# Patient Record
Sex: Female | Born: 1985 | Race: Black or African American | Hispanic: No | Marital: Single | Smoking: Never smoker
Health system: Southern US, Community
[De-identification: ages and names within clinical notes are randomized; demographics above are authoritative.]

## PROBLEM LIST (undated history)

## (undated) ENCOUNTER — Inpatient Hospital Stay (HOSPITAL_COMMUNITY): Payer: Self-pay

## (undated) DIAGNOSIS — E669 Obesity, unspecified: Secondary | ICD-10-CM

## (undated) DIAGNOSIS — Z789 Other specified health status: Secondary | ICD-10-CM

## (undated) DIAGNOSIS — O24419 Gestational diabetes mellitus in pregnancy, unspecified control: Secondary | ICD-10-CM

## (undated) HISTORY — PX: NO PAST SURGERIES: SHX2092

## (undated) HISTORY — DX: Other specified health status: Z78.9

---

## 2020-03-09 ENCOUNTER — Emergency Department (HOSPITAL_COMMUNITY)
Admission: EM | Admit: 2020-03-09 | Discharge: 2020-03-09 | Disposition: A | Discharge status: Home / Self Care | Payer: Self-pay | Attending: Emergency Medicine | Admitting: Emergency Medicine

## 2020-03-09 ENCOUNTER — Emergency Department (HOSPITAL_COMMUNITY): Payer: Self-pay

## 2020-03-09 ENCOUNTER — Encounter (HOSPITAL_COMMUNITY): Payer: Self-pay | Admitting: Emergency Medicine

## 2020-03-09 ENCOUNTER — Other Ambulatory Visit: Payer: Self-pay

## 2020-03-09 DIAGNOSIS — K2950 Unspecified chronic gastritis without bleeding: Secondary | ICD-10-CM | POA: Insufficient documentation

## 2020-03-09 DIAGNOSIS — K2970 Gastritis, unspecified, without bleeding: Secondary | ICD-10-CM

## 2020-03-09 LAB — URINALYSIS, ROUTINE W REFLEX MICROSCOPIC
Bilirubin Urine: NEGATIVE
Glucose, UA: NEGATIVE mg/dL
Ketones, ur: NEGATIVE mg/dL
Nitrite: NEGATIVE
Protein, ur: NEGATIVE mg/dL
Specific Gravity, Urine: 1.012 (ref 1.005–1.030)
pH: 5 (ref 5.0–8.0)

## 2020-03-09 LAB — LIPASE, BLOOD: Lipase: 37 U/L (ref 11–51)

## 2020-03-09 LAB — COMPREHENSIVE METABOLIC PANEL
ALT: 14 U/L (ref 0–44)
AST: 17 U/L (ref 15–41)
Albumin: 3.9 g/dL (ref 3.5–5.0)
Alkaline Phosphatase: 47 U/L (ref 38–126)
Anion gap: 11 (ref 5–15)
BUN: 9 mg/dL (ref 6–20)
CO2: 25 mmol/L (ref 22–32)
Calcium: 8.7 mg/dL — ABNORMAL LOW (ref 8.9–10.3)
Chloride: 100 mmol/L (ref 98–111)
Creatinine, Ser: 0.56 mg/dL (ref 0.44–1.00)
GFR, Estimated: >60 mL/min (ref >60)
Glucose, Bld: 104 mg/dL — ABNORMAL HIGH (ref 70–99)
Potassium: 3.7 mmol/L (ref 3.5–5.1)
Sodium: 136 mmol/L (ref 135–145)
Total Bilirubin: 0.6 mg/dL (ref 0.3–1.2)
Total Protein: 7.5 g/dL (ref 6.5–8.1)

## 2020-03-09 LAB — CBC
HCT: 39.8 % (ref 36.0–46.0)
Hemoglobin: 13 g/dL (ref 12.0–15.0)
MCH: 28.1 pg (ref 26.0–34.0)
MCHC: 32.7 g/dL (ref 30.0–36.0)
MCV: 86.1 fL (ref 80.0–100.0)
Platelets: 273 10*3/uL (ref 150–400)
RBC: 4.62 MIL/uL (ref 3.87–5.11)
RDW: 12.7 % (ref 11.5–15.5)
WBC: 7.7 10*3/uL (ref 4.0–10.5)
nRBC: 0 % (ref 0.0–0.2)

## 2020-03-09 LAB — I-STAT BETA HCG BLOOD, ED (MC, WL, AP ONLY): I-stat hCG, quantitative: <5 m[IU]/mL (ref <5)

## 2020-03-09 MED ORDER — FAMOTIDINE 20 MG PO TABS: 20.0000 mg | ORAL_TABLET | Freq: Two times a day (BID) | ORAL | 0 refills | Status: DC

## 2020-03-09 MED ORDER — LIDOCAINE VISCOUS HCL 2 % MT SOLN
15.0000 mL | Freq: Once | OROMUCOSAL | Status: AC
  Administered 2020-03-09: 15 mL via ORAL
  Filled 2020-03-09: qty 15

## 2020-03-09 MED ORDER — DICYCLOMINE HCL 10 MG PO CAPS
10.0000 mg | ORAL_CAPSULE | Freq: Once | ORAL | Status: AC
  Administered 2020-03-09: 10 mg via ORAL
  Filled 2020-03-09: qty 1

## 2020-03-09 MED ORDER — ALUM & MAG HYDROXIDE-SIMETH 200-200-20 MG/5ML PO SUSP
30.0000 mL | Freq: Once | ORAL | Status: AC
  Administered 2020-03-09: 30 mL via ORAL
  Filled 2020-03-09: qty 30

## 2020-03-09 MED ORDER — IOHEXOL 300 MG/ML  SOLN
100.0000 mL | Freq: Once | INTRAMUSCULAR | Status: AC | PRN
  Administered 2020-03-09: 100 mL via INTRAVENOUS

## 2020-03-09 NOTE — ED Notes (Signed)
DC instructions reviewed with family.  Family verbalizes understanding and will communicate to pt who does not speak [place].  Pt DC.

## 2020-03-09 NOTE — Discharge Instructions (Signed)
Ayyukanku na yau ya kasance mai gamsarwa gaba?aya. Scan naku bai nuna wani mummunan lahani ba. Ina tsammanin alamun ku na iya zama na biyu zuwa ga gastritis ko ciwon ciki. Don Allah a sha maganin da na rubuta kamar yadda aka umarce ni. Hakanan kuna iya samun wannan kan-da-counter. Da fatan Ronald Lobo a duba takardar bayanin abincin da za ku guje wa don kada ya tsananta alamun ku. Da fatan Ronald Lobo a tabbatar da bin diddigin likita na farko, idan ba ku da ?aya ko ba za ku iya ba, na ba da bayanin tuntu?ar Insurance risk surveyor [facility] wanda ke da asibiti kyauta a nan yankin. [place] ga ER don kowane sabon ko mummuna bayyanar cututtuka.     Your work-up today was overall reassuring.  Your scan did not show any acute abnormalities.  I suspect that your symptoms may be secondary to gastritis or gastric reflux disease.  Please take the medicine I have prescribed as directed.  You may also find this over-the-counter.  Please see the handout on which foods to avoid to not exacerbate your symptoms.  Please make sure to follow-up with the primary care doctor, if you do not have one or cannot afford one, I have provided the contact information for [facility] which is a free clinic here in the area.  Return to the ER for any new or worsening symptoms.

## 2020-03-09 NOTE — ED Provider Notes (Signed)
[facility] EMERGENCY DEPARTMENT Provider Note   CSN: [id number] Arrival date & time: 03/09/20  0857     History Chief Complaint  Patient presents with  . Abdominal Pain    April Boone is a 35 y.o. female.  HPI 35 year old female with no significant medical history per chart review presents to the ER with complaints of epigastric abdominal pain.  History provided by the patient and her friend at bedside who serves as her Nurse, learning disability, patient refusing using a Careers information officer.  States she has had epigastric pain over the last several years, however this has been sitting over the last 3 days.  Describes the pain as "walking around".  States he will radiate into her chest.  Denies any nausea or vomiting.  No fevers or chills.  She has had normal bowel movements.  No dysuria, vaginal bleeding, or discharge.  She states the pain starts if she does not eat, only gets mildly better if she does.  Pain fluctuates, she has not noticed a pattern to it.  Has not taken anything for her symptoms.    History reviewed. No pertinent past medical history.  There are no problems to display for this patient.   History reviewed. No pertinent surgical history.   OB History   No obstetric history on file.     No family history on file.  Social History   Tobacco Use  . Smoking status: Never Smoker  . Smokeless tobacco: Never Used  Substance Use Topics  . Alcohol use: Not Currently  . Drug use: Not Currently    Home Medications Prior to Admission medications   Not on File    Allergies    Patient has no allergy information on record.  Review of Systems   Review of Systems  Constitutional: Negative for chills and fever.  HENT: Negative for ear pain and sore throat.   Eyes: Negative for pain and visual disturbance.  Respiratory: Negative for cough and shortness of breath.   Cardiovascular: Negative for chest pain and palpitations.  Gastrointestinal: Positive  for abdominal pain. Negative for vomiting.  Genitourinary: Negative for dysuria and hematuria.  Musculoskeletal: Negative for arthralgias and back pain.  Skin: Negative for color change and rash.  Neurological: Negative for seizures and syncope.  All other systems reviewed and are negative.   Physical Exam Updated Vital Signs BP 102/66   Pulse 60   Temp 97.7 F (36.5 C)   Resp 18   LMP 02/25/2020   SpO2 98%   Physical Exam Vitals and nursing note reviewed.  Constitutional:      General: She is not in acute distress.    Appearance: She is well-developed and well-nourished. She is not ill-appearing or diaphoretic.  HENT:     Head: Normocephalic and atraumatic.  Eyes:     Conjunctiva/sclera: Conjunctivae normal.  Cardiovascular:     Rate and Rhythm: Normal rate and regular rhythm.     Heart sounds: No murmur heard.   Pulmonary:     Effort: Pulmonary effort is normal. No respiratory distress.     Breath sounds: Normal breath sounds.  Abdominal:     Palpations: Abdomen is soft.     Tenderness: There is abdominal tenderness. There is no right CVA tenderness or left CVA tenderness. Negative signs include Murphy's sign and obturator sign.     Comments: Mild epigastric discomfort on exam, no focal tenderness.  Musculoskeletal:        General: No edema.  Cervical back: Neck supple.  Skin:    General: Skin is warm and dry.  Neurological:     Mental Status: She is alert.  Psychiatric:        Mood and Affect: Mood and affect normal.     ED Results / Procedures / Treatments   Labs (all labs ordered are listed, but only abnormal results are displayed) Labs Reviewed  COMPREHENSIVE METABOLIC PANEL - Abnormal; Notable for the following components:      Result Value   Glucose, Bld 104 (*)    Calcium 8.7 (*)    All other components within normal limits  URINALYSIS, ROUTINE W REFLEX MICROSCOPIC - Abnormal; Notable for the following components:   APPearance CLOUDY (*)     Hgb urine dipstick SMALL (*)    Leukocytes,Ua MODERATE (*)    Bacteria, UA RARE (*)    All other components within normal limits  LIPASE, BLOOD  CBC  I-STAT BETA HCG BLOOD, ED ([facility], [facility], [facility] ONLY)    EKG None  Radiology CT ABDOMEN PELVIS W CONTRAST  Result Date: 03/09/2020 CLINICAL DATA:  35 year old female with epigastric pain. EXAM: CT ABDOMEN AND PELVIS WITH CONTRAST TECHNIQUE: Multidetector CT imaging of the abdomen and pelvis was performed using the standard protocol following bolus administration of intravenous contrast. CONTRAST:  OMNIPAQUE IOHEXOL 300 MG/ML  SOLN COMPARISON:  None. FINDINGS: Lower chest: The visualized lung bases are clear. No intra-abdominal free air or free fluid. Hepatobiliary: No focal liver abnormality is seen. No gallstones, gallbladder wall thickening, or biliary dilatation. Pancreas: Unremarkable. No pancreatic ductal dilatation or surrounding inflammatory changes. Spleen: Normal in size without focal abnormality. Adrenals/Urinary Tract: Adrenal glands are unremarkable. Kidneys are normal, without renal calculi, focal lesion, or hydronephrosis. Bladder is unremarkable. Stomach/Bowel: There is no bowel obstruction or active inflammation. The appendix is normal. Vascular/Lymphatic: The abdominal aorta and IVC are unremarkable. No portal venous gas. There is no adenopathy. Reproductive: The uterus is anteverted and grossly unremarkable. A 2 cm left ovarian dominant follicle. The right ovary is unremarkable. Other: None Musculoskeletal: No acute or significant osseous findings. IMPRESSION: 1. No acute intra-abdominal or pelvic pathology. No bowel obstruction. Normal appendix. 2. A 2 cm left ovarian dominant follicle. Electronically Signed   By: Elgie Collard M.D.   On: 03/09/2020 19:56    Procedures Procedures (including critical care time)  Medications Ordered in ED Medications  dicyclomine (BENTYL) capsule 10 mg (has no administration in time range)  alum &  mag hydroxide-simeth (MAALOX/MYLANTA) 200-200-20 MG/5ML suspension 30 mL (30 mLs Oral Given 03/09/20 1644)    And  lidocaine (XYLOCAINE) 2 % viscous mouth solution 15 mL (15 mLs Oral Given 03/09/20 1644)  iohexol (OMNIPAQUE) 300 MG/ML solution 100 mL (100 mLs Intravenous Contrast Given 03/09/20 1945)    ED Course  I have reviewed the triage vital signs and the nursing notes.  Pertinent labs & imaging results that were available during my care of the patient were reviewed by me and considered in my medical decision making (see chart for details).    MDM Rules/Calculators/A&P                          35 year old female who presents to the ER with waxing waning upper abdominal pain, worsening over the last 3 days.  On arrival, vitals overall reassuring.  Afebrile, not tachycardic.  Physical exam with very mild epigastric tenderness on exam.  No focal lower abdominal tenderness.  Negative Murphy's.  DDx includes cholecystitis, GERD/enteritis, pancreatitis  Labs ordered in triage reviewed and interpreted by me.  CMP and CBC largely unremarkable.  UA with some moderate leukocytes, rare bacteria, however patient denies any dysuria or hematuria.  Low suspicion for UTI, pyelonephritis.  CT of the abdomen ordered and interpreted by me.  This is overall normal with no acute abnormalities.  Low suspicion for cholecystitis, pancreatitis or any other emergency intra-abdominal pathology.  Patient treated with GI cocktail with mild improvement.  Patient was also given some Bentyl.  Patient is hungry, asking for food, tolerating well.  Suspect GERD/gastritis given longevity of symptoms.  Will send home with some Pepcid.  Encouraged PCP follow-up. Final Clinical Impression(s) / ED Diagnoses Final diagnoses:  Gastritis without bleeding, unspecified chronicity, unspecified gastritis type    Rx / DC Orders ED Discharge Orders    None       Leone Brand 03/09/20 2008    Tegeler, Canary Brim,  MD 03/09/20 2009

## 2020-03-09 NOTE — ED Triage Notes (Signed)
C/o generalized abd pain x 3 days.  Denies nausea, vomiting, diarrhea, and urinary complaints.  States she has been having intermittent pain for several years.

## 2020-05-04 ENCOUNTER — Encounter (HOSPITAL_COMMUNITY): Payer: Self-pay | Admitting: Emergency Medicine

## 2020-05-04 ENCOUNTER — Emergency Department (HOSPITAL_COMMUNITY)
Admission: EM | Admit: 2020-05-04 | Discharge: 2020-05-05 | Disposition: A | Discharge status: Home / Self Care | Payer: Self-pay | Attending: Emergency Medicine | Admitting: Emergency Medicine

## 2020-05-04 DIAGNOSIS — R1084 Generalized abdominal pain: Secondary | ICD-10-CM | POA: Insufficient documentation

## 2020-05-04 DIAGNOSIS — N390 Urinary tract infection, site not specified: Secondary | ICD-10-CM

## 2020-05-04 DIAGNOSIS — R300 Dysuria: Secondary | ICD-10-CM | POA: Insufficient documentation

## 2020-05-04 DIAGNOSIS — B9689 Other specified bacterial agents as the cause of diseases classified elsewhere: Secondary | ICD-10-CM | POA: Insufficient documentation

## 2020-05-04 DIAGNOSIS — R82998 Other abnormal findings in urine: Secondary | ICD-10-CM | POA: Insufficient documentation

## 2020-05-04 LAB — URINALYSIS, ROUTINE W REFLEX MICROSCOPIC
Bilirubin Urine: NEGATIVE
Glucose, UA: NEGATIVE mg/dL
Hgb urine dipstick: NEGATIVE
Ketones, ur: NEGATIVE mg/dL
Nitrite: NEGATIVE
Protein, ur: NEGATIVE mg/dL
Specific Gravity, Urine: 1.013 (ref 1.005–1.030)
pH: 7 (ref 5.0–8.0)

## 2020-05-04 LAB — COMPREHENSIVE METABOLIC PANEL
ALT: 15 U/L (ref 0–44)
AST: 18 U/L (ref 15–41)
Albumin: 3.8 g/dL (ref 3.5–5.0)
Alkaline Phosphatase: 48 U/L (ref 38–126)
Anion gap: 8 (ref 5–15)
BUN: 11 mg/dL (ref 6–20)
CO2: 23 mmol/L (ref 22–32)
Calcium: 9.2 mg/dL (ref 8.9–10.3)
Chloride: 106 mmol/L (ref 98–111)
Creatinine, Ser: 0.84 mg/dL (ref 0.44–1.00)
GFR, Estimated: >60 mL/min (ref >60)
Glucose, Bld: 101 mg/dL — ABNORMAL HIGH (ref 70–99)
Potassium: 4.2 mmol/L (ref 3.5–5.1)
Sodium: 137 mmol/L (ref 135–145)
Total Bilirubin: 0.4 mg/dL (ref 0.3–1.2)
Total Protein: 7.3 g/dL (ref 6.5–8.1)

## 2020-05-04 LAB — CBC
HCT: 39.5 % (ref 36.0–46.0)
Hemoglobin: 13.3 g/dL (ref 12.0–15.0)
MCH: 28.9 pg (ref 26.0–34.0)
MCHC: 33.7 g/dL (ref 30.0–36.0)
MCV: 85.9 fL (ref 80.0–100.0)
Platelets: 252 10*3/uL (ref 150–400)
RBC: 4.6 MIL/uL (ref 3.87–5.11)
RDW: 12.3 % (ref 11.5–15.5)
WBC: 6.8 10*3/uL (ref 4.0–10.5)
nRBC: 0 % (ref 0.0–0.2)

## 2020-05-04 LAB — I-STAT BETA HCG BLOOD, ED (MC, WL, AP ONLY): I-stat hCG, quantitative: <5 m[IU]/mL (ref <5)

## 2020-05-04 LAB — LIPASE, BLOOD: Lipase: 44 U/L (ref 11–51)

## 2020-05-04 MED ORDER — KETOROLAC TROMETHAMINE 30 MG/ML IJ SOLN
30.0000 mg | Freq: Once | INTRAMUSCULAR | Status: AC
  Administered 2020-05-04: 30 mg via INTRAVENOUS
  Filled 2020-05-04: qty 1

## 2020-05-04 MED ORDER — NITROFURANTOIN MONOHYD MACRO 100 MG PO CAPS
100.0000 mg | ORAL_CAPSULE | Freq: Once | ORAL | Status: AC
  Administered 2020-05-05: 100 mg via ORAL
  Filled 2020-05-04: qty 1

## 2020-05-04 MED ORDER — NITROFURANTOIN MONOHYD MACRO 100 MG PO CAPS: 100.0000 mg | ORAL_CAPSULE | Freq: Two times a day (BID) | ORAL | 0 refills | Status: DC

## 2020-05-04 NOTE — ED Provider Notes (Addendum)
[facility] EMERGENCY DEPARTMENT Provider Note   CSN: [id number] Arrival date & time: 05/04/20  1842     History Chief Complaint  Patient presents with  . Abdominal Pain    April Boone is a 35 y.o. female.  HPI   35 year old female with past medical history of GERD presents to the emergency department with concern for generalized abdominal discomfort for the past 2 to 3 days with intermittent dysuria at the end of urination.  Patient states the pain is crampy in nature, comes and goes, not related with eating.  She denies any nausea/vomiting/diarrhea.  She states sometimes when she urinates she has a burning sensation at the very end but denies any vaginal bleeding/discharge or other genitourinary symptoms.  No fever.  States she has regular menstrual periods and is not pregnant.  History reviewed. No pertinent past medical history.  There are no problems to display for this patient.   History reviewed. No pertinent surgical history.   OB History   No obstetric history on file.     No family history on file.  Social History   Tobacco Use  . Smoking status: Never Smoker  . Smokeless tobacco: Never Used  Substance Use Topics  . Alcohol use: Not Currently  . Drug use: Not Currently    Home Medications Prior to Admission medications   Medication Sig Start Date End Date Taking? Authorizing Provider  famotidine (PEPCID) 20 MG tablet Take 1 tablet (20 mg total) by mouth 2 (two) times daily. 03/09/20   Mare Ferrari, PA-C    Allergies    Patient has no known allergies.  Review of Systems   Review of Systems  Constitutional: Negative for chills and fever.  HENT: Negative for congestion.   Eyes: Negative for visual disturbance.  Respiratory: Negative for shortness of breath.   Cardiovascular: Negative for chest pain.  Gastrointestinal: Positive for abdominal pain. Negative for diarrhea and vomiting.  Genitourinary: Positive for dysuria.  Negative for difficulty urinating, frequency, vaginal bleeding, vaginal discharge and vaginal pain.  Skin: Negative for rash.  Neurological: Negative for headaches.    Physical Exam Updated Vital Signs BP 121/79   Pulse 78   Temp 98.3 F (36.8 C) (Oral)   Resp 18   LMP 04/20/2020   SpO2 100%   Physical Exam Vitals and nursing note reviewed.  Constitutional:      Appearance: Normal appearance.  HENT:     Head: Normocephalic.     Mouth/Throat:     Mouth: Mucous membranes are moist.  Cardiovascular:     Rate and Rhythm: Normal rate.  Pulmonary:     Effort: Pulmonary effort is normal. No respiratory distress.  Abdominal:     Palpations: Abdomen is soft.     Tenderness: There is no abdominal tenderness. There is no guarding or rebound.  Skin:    General: Skin is warm.  Neurological:     Mental Status: She is alert and oriented to person, place, and time. Mental status is at baseline.  Psychiatric:        Mood and Affect: Mood normal.     ED Results / Procedures / Treatments   Labs (all labs ordered are listed, but only abnormal results are displayed) Labs Reviewed  COMPREHENSIVE METABOLIC PANEL - Abnormal; Notable for the following components:      Result Value   Glucose, Bld 101 (*)    All other components within normal limits  URINALYSIS, ROUTINE W REFLEX MICROSCOPIC - Abnormal;  Notable for the following components:   APPearance CLOUDY (*)    Leukocytes,Ua MODERATE (*)    Bacteria, UA RARE (*)    All other components within normal limits  LIPASE, BLOOD  CBC  I-STAT BETA HCG BLOOD, ED ([facility], [facility], [facility] ONLY)    EKG None  Radiology No results found.  Procedures Procedures   Medications Ordered in ED Medications  ketorolac (TORADOL) 30 MG/ML injection 30 mg (has no administration in time range)    ED Course  I have reviewed the triage vital signs and the nursing notes.  Pertinent labs & imaging results that were available during my care of the patient  were reviewed by me and considered in my medical decision making (see chart for details).    MDM Rules/Calculators/A&P                          Vital signs are stable, abdominal exam is benign.  Pregnancy test is negative, blood work is reassuring, abdominal labs are normal.  Urinalysis is cloudy with moderate leuk esterase and rare bacteria, in the setting of her dysuria is suspicious for UTI.  We will treat her and send the urine for culture.  With her reassuring vitals, labs and physical exam I do not see any need for emergent CT imaging.  Patient will be discharged and treated as an outpatient.  Discharge plan and strict return to ED precautions discussed, patient verbalizes understanding and agreement.  Final Clinical Impression(s) / ED Diagnoses Final diagnoses:  None    Rx / DC Orders ED Discharge Orders    None       Rozelle Logan, DO 05/04/20 2233    Rozelle Logan, DO 05/04/20 2314

## 2020-05-04 NOTE — Discharge Instructions (Addendum)
You have been seen and discharged from the emergency department.  You appear to have a urinary tract infection, take antibiotic as directed.  Take Tylenol and ibuprofen as needed for pain control.  Stable hydrated.  Follow-up with your primary provider for reevaluation. Take home medications as prescribed. If you have any worsening symptoms, worsening abdominal pain or further concerns for health please return to an emergency department for further evaluation.

## 2020-05-04 NOTE — ED Triage Notes (Signed)
Pt c/o generalized abd pain intermittent x 2-3 days with intermittent urinary s/s. Denies n/v/d.

## 2020-05-05 NOTE — ED Notes (Addendum)
Pt ambulatory to 026. Pt reports abd pain x couple of days. Vitals taken. A&Ox4. NAD. Respirations regular/unlabored. Connected to BP and pulse ox. stretcher low, wheels locked, call bell within reach.

## 2020-05-07 LAB — URINE CULTURE: Culture: 10000 — AB

## 2020-05-30 ENCOUNTER — Ambulatory Visit: Payer: Self-pay | Admitting: Clinical

## 2020-05-30 DIAGNOSIS — F4310 Post-traumatic stress disorder, unspecified: Secondary | ICD-10-CM

## 2020-06-06 ENCOUNTER — Ambulatory Visit: Payer: Self-pay | Admitting: Clinical

## 2020-06-06 DIAGNOSIS — F4310 Post-traumatic stress disorder, unspecified: Secondary | ICD-10-CM
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
## 2020-06-07 ENCOUNTER — Other Ambulatory Visit: Payer: Self-pay

## 2020-06-07 ENCOUNTER — Ambulatory Visit: Payer: Self-pay | Admitting: Clinical

## 2020-06-07 DIAGNOSIS — Z599 Problem related to housing and economic circumstances, unspecified: Secondary | ICD-10-CM

## 2020-06-10 NOTE — Progress Notes (Signed)
CASE MANAGEMENT  DATE: 06/07/20 TIME: 10:15-1:30pm (returned to clinic) WHERE: [facility] ID drive, and [employer] food pantry    LCSW met with patient for scheduled pick up to take to [employer] [facility] ID drive. LCSW sat with patient to assist with orientation and fill out documents to obtain [facility] ID. [facility] informed in about a week will receive via mail. [facility] informed patient of their walk in hours to discuss immigration documents to assist.  Patient asked LCSW if able to take to near by pantry as it doesn't request any ID. Patient and LCSW drove to [employer] and was able to sign up for routine membership where she will be able to pick up food on a weekly basis. LCSW encouraged to come early as there is a wait list. Patient expressed gratitude to LCSW and MSW intern for coordinating appointment and taking to pantry. Patient expressed she was very happy with her groceries received. LCSW had patient sign ROI for [employer] community enrichment and [place] law clinic to continue with ongoing case management. LCSW will email referral on Monday (4/11)

## 2020-06-12 ENCOUNTER — Ambulatory Visit: Payer: Self-pay | Admitting: Clinical

## 2020-06-12 DIAGNOSIS — F4310 Post-traumatic stress disorder, unspecified: Secondary | ICD-10-CM

## 2020-06-12 DIAGNOSIS — Z599 Problem related to housing and economic circumstances, unspecified: Secondary | ICD-10-CM

## 2020-06-13 ENCOUNTER — Other Ambulatory Visit: Payer: Self-pay

## 2020-06-13 NOTE — Progress Notes (Signed)
CASE MANAGEMENT  DATE: 06/07/2019 TIME: 10 am - 10:30 am WHERE: patient's home; clinic office   LCSW and MSW Intern Lauree Chandler met with patient to discuss her current needs at her home. Intern introduced the patient again to the LCSW and explained that the LCSW would be the person taking her to the ID drive event the following day. The intern wrote out a note with the time that the LCSW would arrive to pick her up and what she needed to bring with her for the appointment. The LCSW and Intern also helped the patient prepare all of the documents she needed to bring to receive an ID from [employer]. Lastly, the LCSW and Intern dropped off more food donations for the patient and her family, in addition to giving her two $25 gift cards to [place] and Paediatric nurse from [employer]. LCSW and intern assisted patient with service. LCSW and intern assessed for safety. LCSW and internoffered next appointment for case management.    When they came back to [facility] [place] [facility], the intern emailed [facility] after discovering that the patient did not have a former ID with her photo on it or $10 case to bring to the ID event. The [employer] [facility] representative responded and said that they would still be happy to work with the patient regardless of these details. The intern emailed the [facility] once more to check in about their services, but an employee responded and said that they were not taking on new asylee cases right now. The [employer] employee did send the intern a list of resources for other immigration clinics in the area, and the intern went ahead and reached out to one of them: [employer]. This attorney's office responded and said they were unsure what their current immigration caseload was, but they would get back to her when they could.
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
## 2020-06-19 ENCOUNTER — Ambulatory Visit: Payer: Self-pay | Admitting: Clinical

## 2020-06-19 ENCOUNTER — Other Ambulatory Visit: Payer: Self-pay

## 2020-06-19 DIAGNOSIS — Z599 Problem related to housing and economic circumstances, unspecified: Secondary | ICD-10-CM

## 2020-06-19 DIAGNOSIS — F4310 Post-traumatic stress disorder, unspecified: Secondary | ICD-10-CM

## 2020-06-19 NOTE — Progress Notes (Signed)
CASE MANAGEMENT  DATE: 06/19/2020 TIME: 2 pm - 3 pm WHERE: patient's home   LCSW and MSW intern Susannah met with patient in her home to discuss current needs. They were surprised to see the patient's children all at home with her but then remembered that the children's schools were on spring break. The patient also had a friend over with her children who immigrated in the same fashion that the patient did. Since arriving in the [place] in 2015, the patient's friend shared that she had won her family's asylum case and gotten her license. The intern and LCSW congratulated the woman's success with her and encouraged her assistance with the patient's current legal situation. LCSW explained to the patient that they had been contacting [employer] to see whether they would be able to schedule a consultation appointment for the patient. LCSW and the intern brought over the consultation paperwork, but due to the busy home environment, the LCSW agreed to bring it back on another date. The intern explained that she would no longer be working with the patient because she was finishing her internship at the clinic [facility]), and thanked her for allowing her to work with her for the last month. The patient said that she would miss the intern and thanked her for all of her assistance.  LCSW assisted patient with service. LCSW assessed for safety. LCSW offered next appointment for case management.

## 2020-07-01 ENCOUNTER — Other Ambulatory Visit: Payer: Self-pay

## 2020-07-01 ENCOUNTER — Inpatient Hospital Stay (HOSPITAL_COMMUNITY): Payer: Self-pay

## 2020-07-01 ENCOUNTER — Encounter (HOSPITAL_COMMUNITY): Payer: Self-pay | Admitting: Obstetrics & Gynecology

## 2020-07-01 ENCOUNTER — Ambulatory Visit (HOSPITAL_COMMUNITY)
Admission: EM | Admit: 2020-07-01 | Discharge: 2020-07-01 | Disposition: A | Discharge status: Home / Self Care | Payer: Self-pay | Attending: Student | Admitting: Student

## 2020-07-01 ENCOUNTER — Inpatient Hospital Stay (HOSPITAL_COMMUNITY)
Admission: AD | Admit: 2020-07-01 | Discharge: 2020-07-01 | Disposition: A | Discharge status: Home / Self Care | Payer: Self-pay | Attending: Obstetrics & Gynecology | Admitting: Obstetrics & Gynecology

## 2020-07-01 ENCOUNTER — Encounter (HOSPITAL_COMMUNITY): Payer: Self-pay

## 2020-07-01 DIAGNOSIS — O09511 Supervision of elderly primigravida, first trimester: Secondary | ICD-10-CM | POA: Insufficient documentation

## 2020-07-01 DIAGNOSIS — O26891 Other specified pregnancy related conditions, first trimester: Secondary | ICD-10-CM

## 2020-07-01 DIAGNOSIS — O99611 Diseases of the digestive system complicating pregnancy, first trimester: Secondary | ICD-10-CM

## 2020-07-01 DIAGNOSIS — O99891 Other specified diseases and conditions complicating pregnancy: Secondary | ICD-10-CM

## 2020-07-01 DIAGNOSIS — O3680X0 Pregnancy with inconclusive fetal viability, not applicable or unspecified: Secondary | ICD-10-CM | POA: Insufficient documentation

## 2020-07-01 DIAGNOSIS — Z3A01 Less than 8 weeks gestation of pregnancy: Secondary | ICD-10-CM | POA: Insufficient documentation

## 2020-07-01 DIAGNOSIS — R109 Unspecified abdominal pain: Secondary | ICD-10-CM

## 2020-07-01 DIAGNOSIS — O26899 Other specified pregnancy related conditions, unspecified trimester: Secondary | ICD-10-CM

## 2020-07-01 DIAGNOSIS — Z3201 Encounter for pregnancy test, result positive: Secondary | ICD-10-CM

## 2020-07-01 DIAGNOSIS — K219 Gastro-esophageal reflux disease without esophagitis: Secondary | ICD-10-CM

## 2020-07-01 HISTORY — DX: Obesity, unspecified: E66.9

## 2020-07-01 LAB — POCT URINALYSIS DIPSTICK, ED / UC
Bilirubin Urine: NEGATIVE
Glucose, UA: NEGATIVE mg/dL
Hgb urine dipstick: NEGATIVE
Ketones, ur: NEGATIVE mg/dL
Leukocytes,Ua: NEGATIVE
Nitrite: NEGATIVE
Protein, ur: NEGATIVE mg/dL
Specific Gravity, Urine: 1.015 (ref 1.005–1.030)
Urobilinogen, UA: 0.2 mg/dL (ref 0.0–1.0)
pH: 7 (ref 5.0–8.0)

## 2020-07-01 LAB — WET PREP, GENITAL
Clue Cells Wet Prep HPF POC: NONE SEEN
Sperm: NONE SEEN
Trich, Wet Prep: NONE SEEN
Yeast Wet Prep HPF POC: NONE SEEN

## 2020-07-01 LAB — POC URINE PREG, ED: Preg Test, Ur: POSITIVE — AB

## 2020-07-01 LAB — ABO/RH: ABO/RH(D): B POS

## 2020-07-01 LAB — CBC
HCT: 39.6 % (ref 36.0–46.0)
Hemoglobin: 13 g/dL (ref 12.0–15.0)
MCH: 27.8 pg (ref 26.0–34.0)
MCHC: 32.8 g/dL (ref 30.0–36.0)
MCV: 84.6 fL (ref 80.0–100.0)
Platelets: 251 10*3/uL (ref 150–400)
RBC: 4.68 MIL/uL (ref 3.87–5.11)
RDW: 13 % (ref 11.5–15.5)
WBC: 8.5 10*3/uL (ref 4.0–10.5)
nRBC: 0 % (ref 0.0–0.2)

## 2020-07-01 LAB — HCG, QUANTITATIVE, PREGNANCY: hCG, Beta Chain, Quant, S: 1019 m[IU]/mL — ABNORMAL HIGH (ref <5)

## 2020-07-01 MED ORDER — PRENATAL VITAMINS 28-0.8 MG PO TABS: 1.0000 | ORAL_TABLET | Freq: Every day | ORAL | 3 refills | Status: DC

## 2020-07-01 NOTE — ED Provider Notes (Signed)
[facility]    CSN: [id number] Arrival date & time: 07/01/20  1457      History   Chief Complaint Chief Complaint  Patient presents with  . Abdominal Pain  . Back Pain    HPI April Boone is a 35 y.o. female presenting with abdominal and back pain intermittently x2 weeks.  This patient adamantly denies translation services, though there was some language barrier during our conversation today.  She endorses 2 weeks of intermittent abdominal and back pain.  Describes this is lower abdominal and crampy, lasting few minutes at a time.  Episodes are becoming more frequent.  Similar lower back pain.  Denies vaginal symptoms including spotting, discharge, dysuria, urinary frequency, rashes.  Denies fever/chills.  Last menstrual period was 5 weeks ago.  HPI  Past Medical History:  Diagnosis Date  . Obese     There are no problems to display for this patient.   History reviewed. No pertinent surgical history.  OB History   No obstetric history on file.      Home Medications    Prior to Admission medications   Medication Sig Start Date End Date Taking? Authorizing Provider  famotidine (PEPCID) 20 MG tablet Take 1 tablet (20 mg total) by mouth 2 (two) times daily. 03/09/20   Mare Ferrari, PA-C  nitrofurantoin, macrocrystal-monohydrate, (MACROBID) 100 MG capsule Take 1 capsule (100 mg total) by mouth 2 (two) times daily. 05/04/20   Horton, Clabe Seal, DO    Family History Family History  Problem Relation Age of Onset  . Healthy Mother   . Healthy Father     Social History Social History   Tobacco Use  . Smoking status: Never Smoker  . Smokeless tobacco: Never Used  Substance Use Topics  . Alcohol use: Not Currently  . Drug use: Not Currently     Allergies   Patient has no known allergies.   Review of Systems Review of Systems  Constitutional: Negative for appetite change, chills, diaphoresis and fever.  Respiratory: Negative for shortness of  breath.   Cardiovascular: Negative for chest pain.  Gastrointestinal: Positive for abdominal pain. Negative for blood in stool, constipation, diarrhea, nausea and vomiting.  Genitourinary: Positive for flank pain. Negative for decreased urine volume, difficulty urinating, dysuria, frequency, genital sores, hematuria, menstrual problem, pelvic pain, urgency, vaginal bleeding, vaginal discharge and vaginal pain.  Musculoskeletal: Negative for back pain.  Neurological: Negative for dizziness, weakness and light-headedness.  All other systems reviewed and are negative.    Physical Exam Triage Vital Signs ED Triage Vitals  Enc Vitals Group     BP 07/01/20 1550 135/70     Pulse Rate 07/01/20 1550 97     Resp 07/01/20 1550 17     Temp 07/01/20 1550 98.5 F (36.9 C)     Temp Source 07/01/20 1550 Oral     SpO2 07/01/20 1550 100 %     Weight --      Height --      Head Circumference --      Peak Flow --      Pain Score 07/01/20 1545 3     Pain Loc --      Pain Edu? --      Excl. in GC? --    No data found.  Updated Vital Signs BP 135/70 (BP Location: Right Arm)   Pulse 97   Temp 98.5 F (36.9 C) (Oral)   Resp 17   LMP 05/25/2020   SpO2 100%  Visual Acuity Right Eye Distance:   Left Eye Distance:   Bilateral Distance:    Right Eye Near:   Left Eye Near:    Bilateral Near:     Physical Exam Vitals reviewed.  Constitutional:      General: She is not in acute distress.    Appearance: Normal appearance. She is not ill-appearing.  HENT:     Head: Normocephalic and atraumatic.     Mouth/Throat:     Mouth: Mucous membranes are moist.     Comments: Moist mucous membranes Eyes:     Extraocular Movements: Extraocular movements intact.     Pupils: Pupils are equal, round, and reactive to light.  Cardiovascular:     Rate and Rhythm: Normal rate and regular rhythm.     Heart sounds: Normal heart sounds.  Pulmonary:     Effort: Pulmonary effort is normal.     Breath  sounds: Normal breath sounds. No wheezing, rhonchi or rales.  Abdominal:     General: Bowel sounds are normal. There is no distension.     Palpations: Abdomen is soft. There is no mass.     Tenderness: There is abdominal tenderness in the suprapubic area. There is right CVA tenderness and left CVA tenderness. There is no guarding or rebound. Negative signs include Murphy's sign, Rovsing's sign and McBurney's sign.  Skin:    General: Skin is warm.     Capillary Refill: Capillary refill takes less than 2 seconds.     Comments: Good skin turgor  Neurological:     General: No focal deficit present.     Mental Status: She is alert and oriented to person, place, and time.  Psychiatric:        Mood and Affect: Mood normal.        Behavior: Behavior normal.      UC Treatments / Results  Labs (all labs ordered are listed, but only abnormal results are displayed) Labs Reviewed  POC URINE PREG, ED - Abnormal; Notable for the following components:      Result Value   Preg Test, Ur POSITIVE (*)    All other components within normal limits  POCT URINALYSIS DIPSTICK, ED / UC    EKG   Radiology No results found.  Procedures Procedures (including critical care time)  Medications Ordered in UC Medications - No data to display  Initial Impression / Assessment and Plan / UC Course  I have reviewed the triage vital signs and the nursing notes.  Pertinent labs & imaging results that were available during my care of the patient were reviewed by me and considered in my medical decision making (see chart for details).     This patient is a 35 year old female presenting with abdominal and back pain.  Positive urine pregnancy test today.  She is afebrile and nontachycardic, denies other vaginal and urinary symptoms.  UA is normal today.  I am recommending this patient head to the [facility] given her abdominal pain and lack of prenatal care/close OB follow-up.  Discussed that if she does  not head to the [facility] for further evaluation, she could lose her baby.  She understands and states she will go straight there.  She is hemodynamically stable for transfer in person vehicle at this time. Pt with concurrent GERD- continue current regimen.   Final Clinical Impressions(s) / UC Diagnoses   Final diagnoses:  [redacted] weeks gestation of pregnancy  Abdominal pain during pregnancy in first trimester  Other specified diseases  and conditions complicating pregnancy  Positive pregnancy test  Gastroesophageal reflux disease without esophagitis     Discharge Instructions     Head straight to [facility] for further evaluation of your abdominal and back pain during pregnancy. Please head straight there to ensure your baby is healthy and in no distress.    ED Prescriptions    None     PDMP not reviewed this encounter.   Rhys Martini, PA-C 07/01/20 [phone]

## 2020-07-01 NOTE — Discharge Instructions (Addendum)
Head straight to [facility] for further evaluation of your abdominal and back pain during pregnancy. Please head straight there to ensure your baby is healthy and in no distress.

## 2020-07-01 NOTE — ED Triage Notes (Addendum)
Pt is here with on & off abdominal, back pain that started 2 weeks ago, pt has not taken any meds to relieve discomfort.

## 2020-07-01 NOTE — Discharge Instructions (Signed)
Abdominal Pain During Pregnancy Belly (abdominal) pain is common during pregnancy. There are many possible causes. Some causes are more serious than others. Sometimes the cause is not known. Always tell your doctor if you have belly pain. Follow these instructions at home:  Do not have sex or put anything in your vagina until your pain goes away completely.  Get plenty of rest until your pain gets better.  Drink enough fluid to keep your pee (urine) pale yellow.  Take over-the-counter and prescription medicines only as told by your doctor.  Keep all follow-up visits.   Contact a doctor if:  You keep having pain after resting.  Your pain gets worse after resting.  You have lower belly pain that: ? Comes and goes at regular times. ? Spreads to your back. ? Feels like menstrual cramps.  You have pain or burning when you pee (urinate). Get help right away if:  You have a fever or chills.  You feel like it is hard to breathe.  You have bleeding from your vagina.  You are leaking fluid or tissue from your vagina.  You vomit for more than 24 hours.  You have watery poop (diarrhea) for more than 24 hours.  Your baby is moving less than usual.  You feel very weak or faint.  You have very bad pain in your upper belly. Summary  Belly pain is common during pregnancy. There are many possible causes.  If you have belly pain during pregnancy, tell your doctor right away.  Keep all follow-up visits. This information is not intended to replace advice given to you by your health care provider. Make sure you discuss any questions you have with your health care provider. Document Revised: 10/31/2019 Document Reviewed: 10/31/2019 Elsevier Patient Education  2021 Elsevier Inc.  

## 2020-07-01 NOTE — MAU Note (Addendum)
Pain in lower abd and lower back, off and on for 2 wks.  Felt like cycle was going to start.  UC confirmed preg.  No bleeding or d/c.

## 2020-07-01 NOTE — MAU Provider Note (Signed)
History     CSN: [id number]  Arrival date and time: 07/01/20 1655   None     Chief Complaint  Patient presents with  . Abdominal Pain   HPI   [name] is a 35 y.o. female G1P0 @ [redacted]w[redacted]d here with lower pelvic pain. She missed her period last month and knew she was pregnant. The pain started 2 weeks ago and the pain comes and goes. No bleeding. She has not tried anything for the pain. The pain is in her lower abdomen both sides,  and radiates around to her lower back. She has not started prenatal care.   Patient declined an interpretor.   OB History    Gravida  1   Para      Term      Preterm      AB      Living        SAB      IAB      Ectopic      Multiple      Live Births              Past Medical History:  Diagnosis Date  . Obese     No past surgical history on file.  Family History  Problem Relation Age of Onset  . Healthy Mother   . Healthy Father     Social History   Tobacco Use  . Smoking status: Never Smoker  . Smokeless tobacco: Never Used  Substance Use Topics  . Alcohol use: Not Currently  . Drug use: Not Currently    Allergies: No Known Allergies  Medications Prior to Admission  Medication Sig Dispense Refill Last Dose  . famotidine (PEPCID) 20 MG tablet Take 1 tablet (20 mg total) by mouth 2 (two) times daily. 30 tablet 0   . nitrofurantoin, macrocrystal-monohydrate, (MACROBID) 100 MG capsule Take 1 capsule (100 mg total) by mouth 2 (two) times daily. 10 capsule 0    Results for orders placed or performed during the hospital encounter of 07/01/20 (from the past 48 hour(s))  CBC     Status: None   Collection Time: 07/01/20  6:27 PM  Result Value Ref Range   WBC 8.5 4.0 - 10.5 K/uL   RBC 4.68 3.87 - 5.11 MIL/uL   Hemoglobin 13.0 12.0 - 15.0 g/dL   HCT 21.3 08.6 - 57.8 %   MCV 84.6 80.0 - 100.0 fL   MCH 27.8 26.0 - 34.0 pg   MCHC 32.8 30.0 - 36.0 g/dL   RDW 46.9 62.9 - 52.8 %   Platelets 251 150 - 400 K/uL    nRBC 0.0 0.0 - 0.2 %    Comment: Performed at [facility], [address], [place], [place] [address]  hCG, quantitative, pregnancy     Status: Abnormal   Collection Time: 07/01/20  6:27 PM  Result Value Ref Range   hCG, Beta Chain, Quant, S 1,019 (H) <5 mIU/mL    Comment:          GEST. AGE      CONC.  (mIU/mL)   <=1 WEEK        5 - 50     2 WEEKS       50 - 500     3 WEEKS       100 - 10,000     4 WEEKS     1,000 - 30,000     5 WEEKS     3,500 -  115,000   6-8 WEEKS     12,000 - 270,000    12 WEEKS     15,000 - 220,000        FEMALE AND NON-PREGNANT FEMALE:     LESS THAN 5 mIU/mL Performed at [facility], [address], [place], [place] [address]   ABO/Rh     Status: None   Collection Time: 07/01/20  6:35 PM  Result Value Ref Range   ABO/RH(D) B POS    No rh immune globuloin      NOT A RH IMMUNE GLOBULIN CANDIDATE, PT RH POSITIVE Performed at [facility], [name], [place], [place] [address]   Wet prep, genital     Status: Abnormal   Collection Time: 07/01/20  8:20 PM   Specimen: PATH Cytology Cervicovaginal Ancillary Only  Result Value Ref Range   Yeast Wet Prep HPF POC NONE SEEN NONE SEEN   Trich, Wet Prep NONE SEEN NONE SEEN   Clue Cells Wet Prep HPF POC NONE SEEN NONE SEEN   WBC, Wet Prep HPF POC FEW (A) NONE SEEN   Sperm NONE SEEN     Comment: Performed at [facility], [address], [place], [place] [address]    US OB LESS THAN 14 WEEKS WITH [place] TRANSVAGINAL  Result Date: 07/01/2020 CLINICAL DATA:  Lower back pain and abdominal pain x2 weeks. EXAM: OBSTETRIC <14 WK [place] AND TRANSVAGINAL OB US TECHNIQUE: Both transabdominal and transvaginal ultrasound examinations were performed for complete evaluation of the gestation as well as the maternal uterus, adnexal regions, and pelvic cul-de-sac. Transvaginal technique was performed to assess early pregnancy. COMPARISON:  None. FINDINGS: Intrauterine gestational sac: None Yolk sac:  Not  Visualized. Embryo:  Not Visualized. Cardiac Activity: Not Visualized. Heart Rate: N/A  bpm Maternal uterus/adnexae: A small amount of fluid is seen within the endometrial canal. The right ovary measures 3.8 cm x 2.4 cm x 2.4 cm and is normal in appearance. The left ovary measures 4.0 cm x 2.7 cm x 3.4 cm and is normal in appearance (difficult to visualize as per the ultrasound technologist). No pelvic free fluid is seen. IMPRESSION: Small amount of fluid within the endometrial canal, without evidence of an intrauterine pregnancy. Electronically Signed   By: Aram Candela M.D.   On: 07/01/2020 19:45    Review of Systems  Gastrointestinal: Positive for abdominal pain.  Genitourinary: Negative for vaginal bleeding.   Physical Exam   Blood pressure 121/86, pulse 93, temperature 98.4 F (36.9 C), resp. rate 16, weight 84.4 kg, last menstrual period 05/25/2020, SpO2 100 %.  Physical Exam Constitutional:      General: She is not in acute distress.    Appearance: She is well-developed. She is not ill-appearing, toxic-appearing or diaphoretic.  HENT:     Head: Normocephalic.  Eyes:     Pupils: Pupils are equal, round, and reactive to light.  Abdominal:     Tenderness: There is generalized abdominal tenderness. There is no guarding or rebound.  Skin:    General: Skin is warm.  Neurological:     Mental Status: She is alert and oriented to person, place, and time.    MAU Course  Procedures  MDM  Patient self swabbed in the bathroom d/t MAU bed wait times. Wet prep & GC HIV, CBC, Hcg, ABO US OB transvaginal  B positive blood type   Assessment and Plan   A:  1. Pregnancy of unknown anatomic location  2. Abdominal pain affecting pregnancy   3. [redacted] weeks gestation of pregnancy     P:  Discharge home in stable condition F/u stat quant on Thursday @ 0830 at [facility]  Start prenatal care Rx: Prenatal vitamins Ectopic precautions Pelvic rest.  Duane Lope, NP 07/01/2020 9:56  PM

## 2020-07-01 NOTE — MAU Note (Signed)
Plan explained, blood work ordered - will draw while in lobby.

## 2020-07-02 LAB — GC/CHLAMYDIA PROBE AMP (~~LOC~~) NOT AT ARMC
Chlamydia: NEGATIVE
Comment: NEGATIVE
Comment: NORMAL
Neisseria Gonorrhea: NEGATIVE

## 2020-07-04 ENCOUNTER — Other Ambulatory Visit: Payer: Self-pay

## 2020-07-04 ENCOUNTER — Ambulatory Visit (INDEPENDENT_AMBULATORY_CARE_PROVIDER_SITE_OTHER): Payer: Self-pay

## 2020-07-04 VITALS — BP 129/67 | HR 84 | Wt 185.9 lb

## 2020-07-04 DIAGNOSIS — O3680X0 Pregnancy with inconclusive fetal viability, not applicable or unspecified: Secondary | ICD-10-CM

## 2020-07-04 DIAGNOSIS — Z3042 Encounter for surveillance of injectable contraceptive: Secondary | ICD-10-CM

## 2020-07-04 LAB — BETA HCG QUANT (REF LAB): hCG Quant: 1421 m[IU]/mL

## 2020-07-04 NOTE — Progress Notes (Signed)
Results are back. Reviewed with Dr Donavan Foil.   Warden Fillers, MD  Isabell Jarvis, RN Bhcg increased to [phone], expected rise would have been around 1691.54, advise repeat bhcg in 48 hours. Pt may need methotrexate if abnormal rise again noted.   Call placed to pt. Spoke with pt. Pt given recommendations per Dr Donavan Foil. Pt agreeable to go to MAU on Saturday 07/06/20 for repeat bhcg. Pt verbalized understanding. Ectopic precautions given.   Judeth Cornfield, RN  07/04/20

## 2020-07-04 NOTE — Progress Notes (Signed)
Pt here today for follow up from MAU on 5/2 for repeat Stat beta. Pt denies any vaginal bleeding. Only having mild lower abd cramps and upper abd "gas" pain.   Pt advised will have blood drawn today and when results are back and reviewed by provider, will contact pt with results and plan of care. Pt agreeable and verbalized understanding.   Judeth Cornfield, RN  07/04/20.

## 2020-07-06 ENCOUNTER — Inpatient Hospital Stay (HOSPITAL_COMMUNITY)
Admission: AD | Admit: 2020-07-06 | Discharge: 2020-07-06 | Disposition: A | Discharge status: Home / Self Care | Payer: Self-pay | Attending: Obstetrics & Gynecology | Admitting: Obstetrics & Gynecology

## 2020-07-06 ENCOUNTER — Inpatient Hospital Stay (HOSPITAL_COMMUNITY): Payer: Self-pay

## 2020-07-06 ENCOUNTER — Other Ambulatory Visit: Payer: Self-pay

## 2020-07-06 DIAGNOSIS — Z3A01 Less than 8 weeks gestation of pregnancy: Secondary | ICD-10-CM | POA: Insufficient documentation

## 2020-07-06 DIAGNOSIS — Z3490 Encounter for supervision of normal pregnancy, unspecified, unspecified trimester: Secondary | ICD-10-CM

## 2020-07-06 DIAGNOSIS — O26891 Other specified pregnancy related conditions, first trimester: Secondary | ICD-10-CM | POA: Insufficient documentation

## 2020-07-06 DIAGNOSIS — R109 Unspecified abdominal pain: Secondary | ICD-10-CM | POA: Insufficient documentation

## 2020-07-06 DIAGNOSIS — O468X1 Other antepartum hemorrhage, first trimester: Secondary | ICD-10-CM

## 2020-07-06 DIAGNOSIS — O208 Other hemorrhage in early pregnancy: Secondary | ICD-10-CM | POA: Insufficient documentation

## 2020-07-06 LAB — HCG, QUANTITATIVE, PREGNANCY: hCG, Beta Chain, Quant, S: 1956 m[IU]/mL — ABNORMAL HIGH (ref <5)

## 2020-07-06 NOTE — MAU Provider Note (Signed)
Event Date/Time   First Provider Initiated Contact with Patient 07/06/20 1843     S Ms. Doreena Frances is a 35 y.o. G1P0 pregnant female at [redacted]w[redacted]d who presents to MAU today for repeat bHCG. She has had two levels drawn (07/01/20 and 07/04/20), the second did not have an appropriate rise so she was sent in today for another level. We do not have evidence of an IUP yet, this is a desired pregnancy. Ms. Tamplin confirms continued intermittent, lower abdominal cramping but no bleeding and the pain is not localized to one side or the other.  Pt speaks Hausa, no interpreter available for Hausa. Pt stated she understands enough English to proceed without a Hausa interpreter.  O BP 123/72 (BP Location: Right Arm)   Pulse 75   Temp 98.6 F (37 C) (Oral)   Resp 16   LMP 05/25/2020   SpO2 100%  Physical Exam Vitals and nursing note reviewed.  Constitutional:      General: She is not in acute distress.    Appearance: She is well-developed. She is not ill-appearing.  HENT:     Head: Normocephalic and atraumatic.     Mouth/Throat:     Mouth: Mucous membranes are moist.  Eyes:     Pupils: Pupils are equal, round, and reactive to light.  Cardiovascular:     Rate and Rhythm: Normal rate.     Pulses: Normal pulses.  Pulmonary:     Effort: Pulmonary effort is normal.  Abdominal:     Palpations: Abdomen is soft.     Tenderness: There is no abdominal tenderness.  Musculoskeletal:        General: Normal range of motion.  Skin:    General: Skin is warm and dry.     Capillary Refill: Capillary refill takes less than 2 seconds.  Neurological:     Mental Status: She is alert and oriented to person, place, and time.  Psychiatric:        Mood and Affect: Mood normal.        Behavior: Behavior normal. Behavior is cooperative.        Thought Content: Thought content normal.        Judgment: Judgment normal.    Results for orders placed or performed during the hospital encounter of 07/06/20 (from  the past 24 hour(s))  hCG, quantitative, pregnancy     Status: Abnormal   Collection Time: 07/06/20  2:50 PM  Result Value Ref Range   hCG, Beta Chain, Quant, S 1,956 (H) <5 mIU/mL  US OB Transvaginal  Result Date: 07/06/2020 CLINICAL DATA:  Abdominal cramping. EXAM: OBSTETRIC <14 WK [place] AND TRANSVAGINAL OB US TECHNIQUE: Both transabdominal and transvaginal ultrasound examinations were performed for complete evaluation of the gestation as well as the maternal uterus, adnexal regions, and pelvic cul-de-sac. Transvaginal technique was performed to assess early pregnancy. COMPARISON:  Jul 01, 2020 FINDINGS: Intrauterine gestational sac: Single Yolk sac:  Not Visualized. Embryo:  Not Visualized. Cardiac Activity: Not Visualized. Heart Rate: N/A  bpm MSD: 4.8 mm   5 w   1 d Subchorionic hemorrhage:  Small Maternal uterus/adnexae: The right ovary measures 3.4 cm x 2.7 cm x 2.0 cm and is normal in appearance. The left ovary measures 4.7 cm x 4.8 cm x 3.4 cm. A small left corpus luteum cyst is noted. No pelvic free fluid is seen. IMPRESSION: Single intrauterine gestational sac, at approximately 5 weeks and 1 day gestation by ultrasound evaluation, without visualization of a yolk sac  or fetal pole. While this may be secondary to early intrauterine pregnancy, correlation with follow-up pelvic ultrasound is recommended. Electronically Signed   By: Aram Candela M.D.   On: 07/06/2020 18:02   Inappropriate rise in quant noted again, repeat U/S found IUGS with small [facility]. Reviewed results with Dr. Despina Hidden who recommended pt follow up in two weeks for repeat U/S and be given strong bleeding precautions.  Discussed results with patient in plain terms, recommended pelvic rest and importance of returning to MAU if heavy bleeding or severe abdominal pain. Pt verbalized understanding. Will send message to [facility] for scheduling assistance of repeat U/S (order placed upon discharge).  A Intrauterine pregnancy  Abdominal  cramping affecting pregnancy - Plan: US OB Transvaginal, US OB Transvaginal, Discharge patient, [place] MFM OB Transvaginal  Subchorionic hematoma in first trimester, single or unspecified fetus  P Discharge from MAU in stable condition with bleeding precautions Order for follow-up ultrasound put in, will send message to office for scheduling assistance. Pt uninsured, may start OB care at [facility] if pregnancy is viable.   Bernerd Limbo, [place] 07/06/2020 6:47 PM

## 2020-07-06 NOTE — MAU Note (Signed)
Pt reports to mau for follow up lab work.  Pt denies bleeding.  Reports she is still having the same cramping she had a few days ago.

## 2020-07-12 ENCOUNTER — Inpatient Hospital Stay (HOSPITAL_COMMUNITY)
Admission: AD | Admit: 2020-07-12 | Discharge: 2020-07-12 | Disposition: A | Discharge status: Home / Self Care | Payer: Self-pay | Attending: Obstetrics & Gynecology | Admitting: Obstetrics & Gynecology

## 2020-07-12 ENCOUNTER — Other Ambulatory Visit: Payer: Self-pay

## 2020-07-12 ENCOUNTER — Inpatient Hospital Stay (HOSPITAL_COMMUNITY): Payer: Self-pay

## 2020-07-12 ENCOUNTER — Encounter (HOSPITAL_COMMUNITY): Payer: Self-pay | Admitting: *Deleted

## 2020-07-12 DIAGNOSIS — Z3A01 Less than 8 weeks gestation of pregnancy: Secondary | ICD-10-CM | POA: Insufficient documentation

## 2020-07-12 DIAGNOSIS — O039 Complete or unspecified spontaneous abortion without complication: Secondary | ICD-10-CM | POA: Insufficient documentation

## 2020-07-12 DIAGNOSIS — O26891 Other specified pregnancy related conditions, first trimester: Secondary | ICD-10-CM | POA: Insufficient documentation

## 2020-07-12 DIAGNOSIS — R109 Unspecified abdominal pain: Secondary | ICD-10-CM | POA: Insufficient documentation

## 2020-07-12 DIAGNOSIS — O209 Hemorrhage in early pregnancy, unspecified: Secondary | ICD-10-CM

## 2020-07-12 LAB — CBC
HCT: 35.8 % — ABNORMAL LOW (ref 36.0–46.0)
Hemoglobin: 11.9 g/dL — ABNORMAL LOW (ref 12.0–15.0)
MCH: 28.4 pg (ref 26.0–34.0)
MCHC: 33.2 g/dL (ref 30.0–36.0)
MCV: 85.4 fL (ref 80.0–100.0)
Platelets: 211 10*3/uL (ref 150–400)
RBC: 4.19 MIL/uL (ref 3.87–5.11)
RDW: 12.8 % (ref 11.5–15.5)
WBC: 8.3 10*3/uL (ref 4.0–10.5)
nRBC: 0 % (ref 0.0–0.2)

## 2020-07-12 LAB — COMPREHENSIVE METABOLIC PANEL
ALT: 17 U/L (ref 0–44)
AST: 17 U/L (ref 15–41)
Albumin: 3.5 g/dL (ref 3.5–5.0)
Alkaline Phosphatase: 39 U/L (ref 38–126)
Anion gap: 5 (ref 5–15)
BUN: 6 mg/dL (ref 6–20)
CO2: 26 mmol/L (ref 22–32)
Calcium: 9 mg/dL (ref 8.9–10.3)
Chloride: 106 mmol/L (ref 98–111)
Creatinine, Ser: 0.52 mg/dL (ref 0.44–1.00)
GFR, Estimated: >60 mL/min (ref >60)
Glucose, Bld: 135 mg/dL — ABNORMAL HIGH (ref 70–99)
Potassium: 3.3 mmol/L — ABNORMAL LOW (ref 3.5–5.1)
Sodium: 137 mmol/L (ref 135–145)
Total Bilirubin: 0.6 mg/dL (ref 0.3–1.2)
Total Protein: 6.5 g/dL (ref 6.5–8.1)

## 2020-07-12 LAB — HCG, QUANTITATIVE, PREGNANCY: hCG, Beta Chain, Quant, S: 1316 m[IU]/mL — ABNORMAL HIGH (ref <5)

## 2020-07-12 MED ORDER — OXYCODONE HCL 5 MG PO CAPS: 5.0000 mg | ORAL_CAPSULE | Freq: Four times a day (QID) | ORAL | 0 refills | Status: AC | PRN

## 2020-07-12 MED ORDER — HYDROMORPHONE HCL 1 MG/ML IJ SOLN
1.0000 mg | Freq: Once | INTRAMUSCULAR | Status: AC
Start: 2020-07-12 — End: 2020-07-12
  Administered 2020-07-12: 1 mg via INTRAMUSCULAR
  Filled 2020-07-12: qty 1

## 2020-07-12 NOTE — MAU Note (Signed)
Presents with c/o VB and lower abdominal cramping that began 2 hours ago.  Reports needs to wear sanitary napkin and passing blood clots.

## 2020-07-12 NOTE — MAU Note (Signed)
Patient's primary language is [place], she understands very little [place]. We have been attempting to get an audio interpreter since 1645. Judeth Horn NP made aware that we have not been able to obtain an interpreter. Will notify [facility].

## 2020-07-12 NOTE — MAU Provider Note (Signed)
History     CSN: [id number]  Arrival date and time: 07/12/20 1535   Event Date/Time   First Provider Initiated Contact with Patient 07/12/20 1711      Chief Complaint  Patient presents with  . Vaginal Bleeding  . Abdominal Pain   HPI April Boone is a 35 y.o. G1P0 at [redacted]w[redacted]d who presents with abdominal pain & vaginal bleeding. Symptoms worsening today. Reports suprapubic abdominal cramping that she rates 10/10. Hasn't treated symptoms. Reports increase in bleeding with passage of small clots. Not saturating pads.   Patient's primary language is [place]. [facility] interpreter does not have a Hausa interpreter available at this time. Patient acknowledges that she understand English & is able to easily answer my questions & follow direction.   OB History    Gravida  3   Para  2   Term  2   Preterm      AB      Living        SAB      IAB      Ectopic      Multiple  1   Live Births              Past Medical History:  Diagnosis Date  . Obese     History reviewed. No pertinent surgical history.  Family History  Problem Relation Age of Onset  . Healthy Mother   . Healthy Father     Social History   Tobacco Use  . Smoking status: Never Smoker  . Smokeless tobacco: Never Used  Vaping Use  . Vaping Use: Never used  Substance Use Topics  . Alcohol use: Not Currently  . Drug use: Not Currently    Allergies: No Known Allergies  No medications prior to admission.    Review of Systems  Constitutional: Negative.   Gastrointestinal: Positive for abdominal pain. Negative for diarrhea, nausea and vomiting.  Genitourinary: Positive for vaginal bleeding.   Physical Exam   Blood pressure 117/73, pulse 84, temperature 98.1 F (36.7 C), temperature source Oral, resp. rate 16, weight 83.8 kg, last menstrual period 05/25/2020, SpO2 100 %, unknown if currently breastfeeding.  Physical Exam Vitals and nursing note reviewed. Exam conducted with a chaperone  present.  Constitutional:      General: She is in acute distress (pt obviously uncomfortable).     Appearance: She is well-developed.  HENT:     Head: Normocephalic and atraumatic.  Pulmonary:     Effort: Pulmonary effort is normal. No respiratory distress.  Abdominal:     General: Abdomen is flat.     Palpations: Abdomen is soft.     Tenderness: There is no abdominal tenderness.  Genitourinary:    General: Normal vulva.     Exam position: Lithotomy position.     Vagina: Bleeding present.     Comments: Cervix visually closed. ~ 1 tablespoon of blood pooling in vagina; no active bleeding from os.  Skin:    General: Skin is warm and dry.  Neurological:     Mental Status: She is alert.     MAU Course  Procedures Results for orders placed or performed during the hospital encounter of 07/12/20 (from the past 24 hour(s))  CBC     Status: Abnormal   Collection Time: 07/12/20  4:55 PM  Result Value Ref Range   WBC 8.3 4.0 - 10.5 K/uL   RBC 4.19 3.87 - 5.11 MIL/uL   Hemoglobin 11.9 (L) 12.0 - 15.0 g/dL  HCT 35.8 (L) 36.0 - 46.0 %   MCV 85.4 80.0 - 100.0 fL   MCH 28.4 26.0 - 34.0 pg   MCHC 33.2 30.0 - 36.0 g/dL   RDW 94.1 74.0 - 81.4 %   Platelets 211 150 - 400 K/uL   nRBC 0.0 0.0 - 0.2 %  Comprehensive metabolic panel     Status: Abnormal   Collection Time: 07/12/20  4:55 PM  Result Value Ref Range   Sodium 137 135 - 145 mmol/L   Potassium 3.3 (L) 3.5 - 5.1 mmol/L   Chloride 106 98 - 111 mmol/L   CO2 26 22 - 32 mmol/L   Glucose, Bld 135 (H) 70 - 99 mg/dL   BUN 6 6 - 20 mg/dL   Creatinine, Ser 4.81 0.44 - 1.00 mg/dL   Calcium 9.0 8.9 - 85.6 mg/dL   Total Protein 6.5 6.5 - 8.1 g/dL   Albumin 3.5 3.5 - 5.0 g/dL   AST 17 15 - 41 U/L   ALT 17 0 - 44 U/L   Alkaline Phosphatase 39 38 - 126 U/L   Total Bilirubin 0.6 0.3 - 1.2 mg/dL   GFR, Estimated >31 >49 mL/min   Anion gap 5 5 - 15  hCG, quantitative, pregnancy     Status: Abnormal   Collection Time: 07/12/20  4:55 PM   Result Value Ref Range   hCG, Beta Chain, Quant, S 1,316 (H) <5 mIU/mL   US OB Transvaginal  Result Date: 07/12/2020 CLINICAL DATA:  Pain for past 2 hours. Gestational age by last menstrual period of 6 weeks and 6 days. Estimated due date by last menstrual period 03/01/2021. Last menstrual period 05/25/2020. Decreased beta quantitative HCG of 1,316 from 1,956 EXAM: OBSTETRIC <14 WK [place] AND TRANSVAGINAL OB US TECHNIQUE: Both transabdominal and transvaginal ultrasound examinations were performed for complete evaluation of the gestation as well as the maternal uterus, adnexal regions, and pelvic cul-de-sac. Transvaginal technique was performed to assess early pregnancy. COMPARISON:  Ultrasound Ob Jul 06 2020 FINDINGS: Intrauterine gestational sac: None. Previously identified gestational sac is no longer visualized. Yolk sac:  Not Visualized. Embryo:  Not Visualized. Cardiac Activity: Not Visualized. Maternal uterus/adnexae: The right ovary is unremarkable. The left ovary is not visualized. The uterus is otherwise unremarkable. IMPRESSION: Findings meet definitive criteria for failed pregnancy. This follows SRU consensus guidelines: Diagnostic Criteria for Nonviable Pregnancy Early in the First Trimester. [facility]acy Mis J Med [phone]. Electronically Signed   By: Tish Frederickson M.D.   On: 07/12/2020 18:50    MDM Patient previously seen in MAU on 5/2 & 5/7 Component     Latest Ref Rng & Units 07/01/2020 07/06/2020  HCG, Beta Chain, Quant, S     <5 mIU/mL 1,019 (H) 1,956 (H)   Ultrasound on 5/7 showed IUGS with no yolk sac or fetal pole and small subchorionic hemorrhage. Patient was scheduled for a f/u outpatient viability scan.  Today has significant abdominal pain & heavy vaginal bleeding. Given IM dilaudid with moderate improvement in pain & appears more comfortable.  HCG today is down to 1316 & ultrasound shows empty uterus & no adnexal masses. Reviewed previous labs, ultrasound, and today's results with  Dr. Charlotta Newton - patient should follow up in the office for miscarriage.   RH positive - rhogam not indicated  Assessment and Plan   1. Miscarriage   2. Vaginal bleeding in pregnancy, first trimester   3. Abdominal pain during pregnancy in first trimester    -reviewed bleeding &  pain precautions -Rx oxycodone prn pain -pelvic rest -msg to office for non stat HCG in 1 week, SAB f/u in 2 weeks -Patient verbalized understanding & was able to repeat back diagnosis & plan  Judeth Horn 07/12/2020, 8:39 PM

## 2020-07-12 NOTE — Discharge Instructions (Signed)
Miscarriage A miscarriage is the loss of a pregnancy before the 20th week of pregnancy. Sometimes, a pregnancy ends before a woman knows that she is pregnant. If you lose a pregnancy, talk with your doctor about:  Questions you have about the loss of your baby.  How to work through your grief.  Plans for future pregnancy. What are the causes? Many times, the cause of this condition is not known. What increases the risk? These things may make a pregnant woman more likely to lose a pregnancy: Certain health conditions  Conditions that affect hormones, such as: ? Thyroid disease. ? Polycystic ovary syndrome.  Diabetes.  A disease that causes the body's disease-fighting system to attack itself by mistake.  Infections.  Bleeding problems.  Being very overweight. Lifestyle factors  Using products that have tobacco or nicotine in them.  Being around tobacco smoke.  Having alcohol.  Having a lot of caffeine.  Using drugs. Problems with reproductive organs or parts  Having a cervix that opens and thins before your due date. The cervix is the lowest part of your womb.  Having Asherman syndrome, which leads to: ? Scars in the womb. ? The womb being abnormal in shape.  Growths (fibroids) in the womb.  Problems in the body that are present at birth.  Infection of the cervix or womb. Personal or health history  Injury.  Having lost a pregnancy before.  Being younger than age 18 or older than age 35.  Being around a harmful substance, such as radiation.  Having lead or other heavy metals in: ? Things you eat or drink. ? The air around you.  Using certain medicines. What are the signs or symptoms?  Blood or spots of blood coming from the vagina. You may also have cramps or pain.  Pain or cramps in the belly or low back.  Fluid or tissue coming out of the vagina. How is this treated? Sometimes, treatment is not needed. If you need treatment, you may be  treated with:  A procedure to open the cervix more and take tissue out of the womb.  Medicines. You may get a shot of medicine called Rho(D) immune globulin. Follow these instructions at home: Medicines  Take over-the-counter and prescription medicines only as told by your doctor.  If you were prescribed antibiotic medicine, take it as told by your doctor. Do not stop taking it even if you start to feel better. Activity  Rest as told by your doctor. Ask your doctor what activities are safe for you.  Have someone help you at home during this time. General instructions  Watch how much tissue comes out of the vagina.  Watch the size of any blood clots that come out of the vagina.  Do not have sex or douche until your doctor says it is okay.  Do not put things, such as tampons, in your vagina until your doctor says it is okay.  To help you and your partner with grieving: ? Talk with your doctor. ? See a counselor.  When you are ready, talk with your doctor about: ? Things to do for your health. ? How you can be healthy if you get pregnant again.  Keep all follow-up visits.   Where to find more information  The American College of Obstetricians and Gynecologists: acog.org  U.S. Department of Health and Human Services Office of Women's Health: hrsa.gov/office-womens-health Contact a doctor if:  You have a fever or chills.  There is bad-smelling fluid coming   from the vagina.  You have more bleeding.  Tissue or clots of blood come out of your vagina. Get help right away if:  You have very bad cramps or pain in your back or belly.  You soak more than 2 large pads in an hour for more than 2 hours.  You get light-headed or weak.  You faint.  You feel sad, and you have sad thoughts a lot of the time.  You think about hurting yourself. Get help right awayif you feel like you may hurt yourself or others, or have thoughts about taking your own life. Go to your nearest  emergency room or:  Call your local emergency services (911 in the [place]  Call the National Suicide Prevention Lifeline at 1-800-273-8255. This is open 24 hours a day.  Text the Crisis Text Line at 741741. Summary  A miscarriage is the loss of a pregnancy before the 20th week of pregnancy. Sometimes, a pregnancy ends before a woman knows that she is pregnant.  Follow instructions from your doctor about medicines and activity.  To help you and your partner with grieving, talk with your doctor or a counselor.  Keep all follow-up visits. This information is not intended to replace advice given to you by your health care provider. Make sure you discuss any questions you have with your health care provider. Document Revised: 08/18/2019 Document Reviewed: 08/18/2019 Elsevier Patient Education  2021 Elsevier Inc.  

## 2020-07-19 ENCOUNTER — Other Ambulatory Visit: Payer: Self-pay

## 2020-07-19 DIAGNOSIS — O039 Complete or unspecified spontaneous abortion without complication: Secondary | ICD-10-CM

## 2020-07-20 LAB — BETA HCG QUANT (REF LAB): hCG Quant: 14 m[IU]/mL

## 2020-07-24 ENCOUNTER — Ambulatory Visit: Payer: Self-pay | Admitting: Clinical

## 2020-07-24 ENCOUNTER — Other Ambulatory Visit: Payer: Self-pay

## 2020-07-24 DIAGNOSIS — Z599 Problem related to housing and economic circumstances, unspecified: Secondary | ICD-10-CM

## 2020-07-25 NOTE — Progress Notes (Addendum)
Case Management  Where: Patient's Home Time: 11:15-12:45p (to patient's friend home)  It should be noted patient speaks [place] and some [place]. Hard to understand at times. LCSW presented to patient's house at 11:15 to patient's home to provide a check in and assess for any needs. Patient reports new comer school have been supportive. LCSW provided reflective listening as patient shared recent miscarriage. Patient reports she is seeking a PCP to address abdominal pain. LCSW assisted by filling out [employer] orange card and explained this sliding fee scale could be helpful to pay and identify a close PCP to her home.   Due to having financial support from a her friend and Mosque, encouraged to ask them to fill out the letter of support of [employer]. While identifying bills that are in her name to provide documentation of residency here in [place], patient shared hospital bills that have arrived from [facility]/ medical group. It should be noted these are dated back from March/April and not been paid. LCSW encouraged to apply for their financial assistance program. LCSW spoke to CHW afterwards and it is recommended to call financial assistance to inform them of will be applying to prevent collections. LCSW will draft a letter of support for patient to provide to mosque or a friend who provide her financial support to be provided with the application.   LCSW also provided patient intake information for payment for [employer] local [place] immigration lawyer who could meet with her for consultation to assist her case. Patient expressed understanding of the fees and contact information.    LCSW transported patient to a friends home that near office. LCSW informed due to gas prices unable to transport going forward. LCSW will contact patient to set a new appointment for check in.

## 2020-07-30 ENCOUNTER — Ambulatory Visit (INDEPENDENT_AMBULATORY_CARE_PROVIDER_SITE_OTHER): Payer: Self-pay | Admitting: Family Medicine

## 2020-07-30 ENCOUNTER — Encounter: Payer: Self-pay | Admitting: Family Medicine

## 2020-07-30 ENCOUNTER — Other Ambulatory Visit: Payer: Self-pay

## 2020-07-30 ENCOUNTER — Other Ambulatory Visit (HOSPITAL_COMMUNITY)
Admission: RE | Admit: 2020-07-30 | Discharge: 2020-07-30 | Disposition: A | Discharge status: Home / Self Care | Payer: Self-pay | Source: Ambulatory Visit | Attending: Family Medicine | Admitting: Family Medicine

## 2020-07-30 VITALS — Wt 184.0 lb

## 2020-07-30 DIAGNOSIS — R102 Pelvic and perineal pain: Secondary | ICD-10-CM

## 2020-07-30 DIAGNOSIS — Z124 Encounter for screening for malignant neoplasm of cervix: Secondary | ICD-10-CM

## 2020-07-30 DIAGNOSIS — O039 Complete or unspecified spontaneous abortion without complication: Secondary | ICD-10-CM

## 2020-07-30 MED ORDER — ACETAMINOPHEN 500 MG PO TABS: 500.0000 mg | ORAL_TABLET | Freq: Four times a day (QID) | ORAL | 0 refills | Status: DC | PRN

## 2020-07-30 NOTE — Progress Notes (Signed)
GYNECOLOGY OFFICE VISIT NOTE  History:   Camaryn Lumbert is a 35 y.o. G3P2000 here today for follow up of SAB.  Patient seen in MAU 07/01/2020, over next several weeks diagnosed with SAB due to falling hcg levels (initially rose sub-optimally, peaked at ~1900 and then downtrended, last check 14 on 07/19/2020) TVUS only ever demonstrated IUGS, no other structures seen  Today reports she is sad about miscarriage, wants to know why it happened Still with headache and abdominal pain Also having some vaginal discomfort, difficult to say exactly what through [place] phone interpreter Would like to get pregnant again, not interested in contraception   Health Maintenance Due  Topic Date Due  . COVID-19 Vaccine (1) Never done  . HIV Screening  Never done  . Hepatitis C Screening  Never done  . TETANUS/TDAP  Never done  . PAP SMEAR-Modifier  Never done    Past Medical History:  Diagnosis Date  . Obese     History reviewed. No pertinent surgical history.  The following portions of the patient's history were reviewed and updated as appropriate: allergies, current medications, past family history, past medical history, past social history, past surgical history and problem list.   Health Maintenance:   Last pap: None on file  Last mammogram:  n/a   Review of Systems:  Pertinent items noted in HPI and remainder of comprehensive ROS otherwise negative.  Physical Exam:  Wt 184 lb (83.5 kg)   LMP 05/25/2020  CONSTITUTIONAL: Well-developed, well-nourished female in no acute distress.  HEENT:  Normocephalic, atraumatic. External right and left ear normal. No scleral icterus.  NECK: Normal range of motion, supple, no masses noted on observation SKIN: No rash noted. Not diaphoretic. No erythema. No pallor. MUSCULOSKELETAL: Normal range of motion. No edema noted. NEUROLOGIC: Alert and oriented to person, place, and time. Normal muscle tone coordination.  PSYCHIATRIC: Normal mood and  affect. Normal behavior. Normal judgment and thought content. RESPIRATORY: Effort normal, no problems with respiration noted PELVIC: Normal appearing external genitalia; normal appearing vaginal mucosa and cervix.  No abnormal discharge noted.    Labs and Imaging No results found for this or any previous visit (from the past 168 hour(s)). US OB Transvaginal  Result Date: 07/12/2020 CLINICAL DATA:  Pain for past 2 hours. Gestational age by last menstrual period of 6 weeks and 6 days. Estimated due date by last menstrual period 03/01/2021. Last menstrual period 05/25/2020. Decreased beta quantitative HCG of 1,316 from 1,956 EXAM: OBSTETRIC <14 WK [place] AND TRANSVAGINAL OB US TECHNIQUE: Both transabdominal and transvaginal ultrasound examinations were performed for complete evaluation of the gestation as well as the maternal uterus, adnexal regions, and pelvic cul-de-sac. Transvaginal technique was performed to assess early pregnancy. COMPARISON:  Ultrasound Ob Jul 06 2020 FINDINGS: Intrauterine gestational sac: None. Previously identified gestational sac is no longer visualized. Yolk sac:  Not Visualized. Embryo:  Not Visualized. Cardiac Activity: Not Visualized. Maternal uterus/adnexae: The right ovary is unremarkable. The left ovary is not visualized. The uterus is otherwise unremarkable. IMPRESSION: Findings meet definitive criteria for failed pregnancy. This follows SRU consensus guidelines: Diagnostic Criteria for Nonviable Pregnancy Early in the First Trimester. [facility]acy Mis J Med [phone]. Electronically Signed   By: Tish Frederickson M.D.   On: 07/12/2020 18:50   US OB Transvaginal  Result Date: 07/06/2020 CLINICAL DATA:  Abdominal cramping. EXAM: OBSTETRIC <14 WK [place] AND TRANSVAGINAL OB US TECHNIQUE: Both transabdominal and transvaginal ultrasound examinations were performed for complete evaluation of the gestation as well  as the maternal uterus, adnexal regions, and pelvic cul-de-sac. Transvaginal  technique was performed to assess early pregnancy. COMPARISON:  Jul 01, 2020 FINDINGS: Intrauterine gestational sac: Single Yolk sac:  Not Visualized. Embryo:  Not Visualized. Cardiac Activity: Not Visualized. Heart Rate: N/A  bpm MSD: 4.8 mm   5 w   1 d Subchorionic hemorrhage:  Small Maternal uterus/adnexae: The right ovary measures 3.4 cm x 2.7 cm x 2.0 cm and is normal in appearance. The left ovary measures 4.7 cm x 4.8 cm x 3.4 cm. A small left corpus luteum cyst is noted. No pelvic free fluid is seen. IMPRESSION: Single intrauterine gestational sac, at approximately 5 weeks and 1 day gestation by ultrasound evaluation, without visualization of a yolk sac or fetal pole. While this may be secondary to early intrauterine pregnancy, correlation with follow-up pelvic ultrasound is recommended. Electronically Signed   By: Aram Candela M.D.   On: 07/06/2020 18:02   US OB LESS THAN 14 WEEKS WITH OB TRANSVAGINAL  Result Date: 07/01/2020 CLINICAL DATA:  Lower back pain and abdominal pain x2 weeks. EXAM: OBSTETRIC <14 WK [place] AND TRANSVAGINAL OB US TECHNIQUE: Both transabdominal and transvaginal ultrasound examinations were performed for complete evaluation of the gestation as well as the maternal uterus, adnexal regions, and pelvic cul-de-sac. Transvaginal technique was performed to assess early pregnancy. COMPARISON:  None. FINDINGS: Intrauterine gestational sac: None Yolk sac:  Not Visualized. Embryo:  Not Visualized. Cardiac Activity: Not Visualized. Heart Rate: N/A  bpm Maternal uterus/adnexae: A small amount of fluid is seen within the endometrial canal. The right ovary measures 3.8 cm x 2.4 cm x 2.4 cm and is normal in appearance. The left ovary measures 4.0 cm x 2.7 cm x 3.4 cm and is normal in appearance (difficult to visualize as per the ultrasound technologist). No pelvic free fluid is seen. IMPRESSION: Small amount of fluid within the endometrial canal, without evidence of an intrauterine pregnancy.  Electronically Signed   By: Aram Candela M.D.   On: 07/01/2020 19:45      Assessment and Plan:   Problem List Items Addressed This Visit   None   Visit Diagnoses    Miscarriage    -  Primary   Relevant Medications   acetaminophen (TYLENOL) 500 MG tablet   Other Relevant Orders   Beta hCG quant (ref lab)   Vaginal pain       Relevant Orders   Cervicovaginal ancillary only( [place])   Screening for cervical cancer       Relevant Orders   Cytology - PAP( [place])     #Miscarriage Discussed that most often no cause is found, recommended we repeat HCG to ensure trend to 0 prior to trying to get pregnant again, she is in agreement with plan.  Lab Results  Component Value Date   ABORH B POS 07/01/2020    #Vaginal discomfort Vaginitis panel collected  #Screening for cervical cancer Pap collected   Routine preventative health maintenance measures emphasized. Please refer to After Visit Summary for other counseling recommendations.   Return if symptoms worsen or fail to improve.    Total face-to-face time with patient: 25 minutes.  Over 50% of encounter was spent on counseling and coordination of care.   Venora Maples, MD/[facility] [employer], [facility]

## 2020-07-31 LAB — CERVICOVAGINAL ANCILLARY ONLY
Bacterial Vaginitis (gardnerella): POSITIVE — AB
Candida Glabrata: NEGATIVE
Candida Vaginitis: POSITIVE — AB
Chlamydia: NEGATIVE
Comment: NEGATIVE
Comment: NEGATIVE
Comment: NEGATIVE
Comment: NEGATIVE
Comment: NEGATIVE
Comment: NORMAL
Neisseria Gonorrhea: NEGATIVE
Trichomonas: NEGATIVE

## 2020-07-31 LAB — BETA HCG QUANT (REF LAB): hCG Quant: <1 m[IU]/mL

## 2020-08-01 ENCOUNTER — Telehealth: Payer: Self-pay | Admitting: Family Medicine

## 2020-08-01 LAB — CYTOLOGY - PAP
Comment: NEGATIVE
Diagnosis: NEGATIVE
High risk HPV: NEGATIVE

## 2020-08-01 MED ORDER — METRONIDAZOLE 500 MG PO TABS: 500.0000 mg | ORAL_TABLET | Freq: Two times a day (BID) | ORAL | 0 refills | Status: AC

## 2020-08-01 MED ORDER — FLUCONAZOLE 150 MG PO TABS: 150.0000 mg | ORAL_TABLET | Freq: Once | ORAL | 0 refills | Status: AC

## 2020-08-01 NOTE — Telephone Encounter (Addendum)
Please call patient with [place] interpreter.  Let her know that her miscarriage is complete and her hcg has returned to normal. She can try to get pregnant again at any time.  Her pap smear was normal.   Also please let her know her swab came back positive for BV and yeast, I've sent fluconazole and flagyl to her pharmacy.

## 2020-08-02 NOTE — Telephone Encounter (Signed)
Called pt with [place] interpreter ID [phone]; results and provider recommendation given. Pt verbalizes understanding, all concerns addressed.

## 2020-08-08 ENCOUNTER — Ambulatory Visit (INDEPENDENT_AMBULATORY_CARE_PROVIDER_SITE_OTHER): Payer: Self-pay | Admitting: Clinical

## 2020-08-08 DIAGNOSIS — Z599 Problem related to housing and economic circumstances, unspecified: Secondary | ICD-10-CM

## 2020-08-09 ENCOUNTER — Other Ambulatory Visit: Payer: Self-pay

## 2020-08-12 NOTE — Progress Notes (Signed)
CASE MANAGEMENT  DATE: 08/08/20 TIME: 4:05-5:05p WHERE: Patient's home   LCSW met with patient to provide typed up letter of support to send to [facility]. LCSW encouraged patient to contact [place] to inform she is applying for this assistance to prevent being referred to collection. LCSW gathered documents and had patient sign documents to send  to [facility], only thing missing is her friends signature(person who provides the support). LCSW showed patient how to write information on the envelope to send  in post office. LCSW took patient copies of the documents needed only need written and notarize letter of support to be able to mail or fax to [facility]. LCSW took patient last gift card donated by local church with two goodwill gift cards. LCSW looked up nearest post office which is at a walking distance.   LCSW also encouraged to contact [employer] for more legal and pantry. Patient reports she is waiting to gather the $100 for first consultation to [employer] to be able to complete referral.

## 2020-08-26 ENCOUNTER — Ambulatory Visit (HOSPITAL_COMMUNITY)
Admission: EM | Admit: 2020-08-26 | Discharge: 2020-08-26 | Disposition: A | Discharge status: Home / Self Care | Payer: Self-pay | Attending: Family Medicine | Admitting: Family Medicine

## 2020-08-26 ENCOUNTER — Other Ambulatory Visit: Payer: Self-pay

## 2020-08-26 ENCOUNTER — Encounter (HOSPITAL_COMMUNITY): Payer: Self-pay

## 2020-08-26 DIAGNOSIS — R300 Dysuria: Secondary | ICD-10-CM | POA: Insufficient documentation

## 2020-08-26 DIAGNOSIS — R1030 Lower abdominal pain, unspecified: Secondary | ICD-10-CM | POA: Insufficient documentation

## 2020-08-26 LAB — POCT URINALYSIS DIPSTICK, ED / UC
Bilirubin Urine: NEGATIVE
Glucose, UA: NEGATIVE mg/dL
Hgb urine dipstick: NEGATIVE
Ketones, ur: NEGATIVE mg/dL
Leukocytes,Ua: NEGATIVE
Nitrite: NEGATIVE
Protein, ur: NEGATIVE mg/dL
Specific Gravity, Urine: 1.01 (ref 1.005–1.030)
Urobilinogen, UA: 0.2 mg/dL (ref 0.0–1.0)
pH: 7 (ref 5.0–8.0)

## 2020-08-26 NOTE — ED Triage Notes (Signed)
Pt reports lower abdominal pian, lower back pain and burning when urinating 2 days.

## 2020-08-26 NOTE — ED Provider Notes (Signed)
[facility]    CSN: [id number] Arrival date & time: 08/26/20  1455      History   Chief Complaint Chief Complaint  Patient presents with   Abdominal Pain    HPI April Boone is a 35 y.o. female.   Patient presenting today with lower abdominal pain, low back aching and dysuria off and on since her miscarriage last month.  She denies any vaginal bleeding, vaginal discharge, hematuria, fever, chills, nausea, vomiting, bowel changes.  Not currently taking any medications for symptoms and has no OB/GYN follow-up.  No known chronic pelvic problems.   Past Medical History:  Diagnosis Date   Obese     There are no problems to display for this patient.   History reviewed. No pertinent surgical history.  OB History     Gravida  4   Para  3   Term  3   Preterm      AB      Living  4      SAB      IAB      Ectopic      Multiple  1   Live Births  4            Home Medications    Prior to Admission medications   Medication Sig Start Date End Date Taking? Authorizing Provider  acetaminophen (TYLENOL) 500 MG tablet Take 1-2 tablets (500-1,000 mg total) by mouth every 6 (six) hours as needed for headache. 07/30/20   Venora Maples, MD  famotidine (PEPCID) 20 MG tablet Take 1 tablet (20 mg total) by mouth 2 (two) times daily. 03/09/20   Mare Ferrari, PA-C  Prenatal Vit-Fe Fumarate-FA (PRENATAL VITAMINS) 28-0.8 MG TABS Take 1 tablet by mouth daily. Patient not taking: No sig reported 07/01/20   Rasch, Harolyn Rutherford, NP    Family History Family History  Problem Relation Age of Onset   Healthy Mother    Healthy Father     Social History Social History   Tobacco Use   Smoking status: Never   Smokeless tobacco: Never  Vaping Use   Vaping Use: Never used  Substance Use Topics   Alcohol use: Not Currently   Drug use: Not Currently     Allergies   Patient has no known allergies.   Review of Systems Review of Systems Per  HPI  Physical Exam Triage Vital Signs ED Triage Vitals  Enc Vitals Group     BP 08/26/20 1601 108/74     Pulse Rate 08/26/20 1601 88     Resp 08/26/20 1601 18     Temp 08/26/20 1601 98.4 F (36.9 C)     Temp Source 08/26/20 1601 Oral     SpO2 08/26/20 1601 97 %     Weight --      Height --      Head Circumference --      Peak Flow --      Pain Score 08/26/20 1600 8     Pain Loc --      Pain Edu? --      Excl. in GC? --    No data found.  Updated Vital Signs BP 108/74 (BP Location: Left Arm)   Pulse 88   Temp 98.4 F (36.9 C) (Oral)   Resp 18   LMP 08/10/2020 (Exact Date)   SpO2 97%   Breastfeeding No   Visual Acuity Right Eye Distance:   Left Eye Distance:   Bilateral Distance:  Right Eye Near:   Left Eye Near:    Bilateral Near:     Physical Exam Vitals and nursing note reviewed.  Constitutional:      Appearance: Normal appearance. She is not ill-appearing.  HENT:     Head: Atraumatic.  Eyes:     Extraocular Movements: Extraocular movements intact.     Conjunctiva/sclera: Conjunctivae normal.  Cardiovascular:     Rate and Rhythm: Normal rate and regular rhythm.     Heart sounds: Normal heart sounds.  Pulmonary:     Effort: Pulmonary effort is normal.     Breath sounds: Normal breath sounds.  Abdominal:     General: Bowel sounds are normal. There is no distension.     Palpations: Abdomen is soft. There is no mass.     Tenderness: There is abdominal tenderness. There is no right CVA tenderness, left CVA tenderness, guarding or rebound.     Comments: Very mild lower abdominal/suprapubic tenderness palpation without guarding  Genitourinary:    Comments: GU exam deferred, self swab performed Musculoskeletal:        General: Normal range of motion.     Cervical back: Normal range of motion and neck supple.  Skin:    General: Skin is warm and dry.  Neurological:     Mental Status: She is alert and oriented to person, place, and time.  Psychiatric:         Mood and Affect: Mood normal.        Thought Content: Thought content normal.        Judgment: Judgment normal.   UC Treatments / Results  Labs (all labs ordered are listed, but only abnormal results are displayed) Labs Reviewed  URINE CULTURE  POCT URINALYSIS DIPSTICK, ED / UC  CERVICOVAGINAL ANCILLARY ONLY   EKG  Radiology No results found.  Procedures Procedures (including critical care time)  Medications Ordered in UC Medications - No data to display  Initial Impression / Assessment and Plan / UC Course  I have reviewed the triage vital signs and the nursing notes.  Pertinent labs & imaging results that were available during my care of the patient were reviewed by me and considered in my medical decision making (see chart for details).     Exam and vitals reassuring, urinalysis and urine pregnancy both benign, vaginal swab and urine culture pending for further rule out.  Resources given for outpatient OB/GYN follow-up and discussed women's emergency department at [facility] if symptoms worsening prior to this outpatient follow-up.  Final Clinical Impressions(s) / UC Diagnoses   Final diagnoses:  Lower abdominal pain  Dysuria     Discharge Instructions      Go to the Emergency Department if your symptoms significantly worsen at any time     ED Prescriptions   None    PDMP not reviewed this encounter.   Particia Nearing, [place] 08/26/20 1708

## 2020-08-26 NOTE — Discharge Instructions (Addendum)
Go to the Emergency Department if your symptoms significantly worsen at any time

## 2020-08-27 ENCOUNTER — Telehealth (HOSPITAL_COMMUNITY): Payer: Self-pay | Admitting: Emergency Medicine

## 2020-08-27 LAB — CERVICOVAGINAL ANCILLARY ONLY
Bacterial Vaginitis (gardnerella): NEGATIVE
Candida Glabrata: NEGATIVE
Candida Vaginitis: POSITIVE — AB
Chlamydia: NEGATIVE
Comment: NEGATIVE
Comment: NEGATIVE
Comment: NEGATIVE
Comment: NEGATIVE
Comment: NEGATIVE
Comment: NORMAL
Neisseria Gonorrhea: NEGATIVE
Trichomonas: NEGATIVE

## 2020-08-27 MED ORDER — FLUCONAZOLE 150 MG PO TABS: 150.0000 mg | ORAL_TABLET | Freq: Once | ORAL | 0 refills | Status: AC

## 2020-08-28 LAB — URINE CULTURE

## 2020-11-02 ENCOUNTER — Other Ambulatory Visit: Payer: Self-pay

## 2020-11-02 ENCOUNTER — Inpatient Hospital Stay (HOSPITAL_COMMUNITY)
Admission: AD | Admit: 2020-11-02 | Discharge: 2020-11-02 | Disposition: A | Discharge status: Home / Self Care | Payer: Self-pay | Attending: Emergency Medicine | Admitting: Emergency Medicine

## 2020-11-02 ENCOUNTER — Encounter (HOSPITAL_COMMUNITY): Payer: Self-pay | Admitting: *Deleted

## 2020-11-02 ENCOUNTER — Inpatient Hospital Stay (HOSPITAL_COMMUNITY): Payer: Self-pay

## 2020-11-02 ENCOUNTER — Ambulatory Visit (HOSPITAL_COMMUNITY)
Admission: EM | Admit: 2020-11-02 | Discharge: 2020-11-02 | Disposition: A | Discharge status: Other Acute Inpatient Hospital | Payer: Self-pay

## 2020-11-02 ENCOUNTER — Encounter (HOSPITAL_COMMUNITY): Payer: Self-pay | Admitting: Obstetrics and Gynecology

## 2020-11-02 DIAGNOSIS — R102 Pelvic and perineal pain: Secondary | ICD-10-CM

## 2020-11-02 DIAGNOSIS — R109 Unspecified abdominal pain: Secondary | ICD-10-CM | POA: Insufficient documentation

## 2020-11-02 LAB — COMPREHENSIVE METABOLIC PANEL
ALT: 17 U/L (ref 0–44)
AST: 17 U/L (ref 15–41)
Albumin: 4.2 g/dL (ref 3.5–5.0)
Alkaline Phosphatase: 52 U/L (ref 38–126)
Anion gap: 8 (ref 5–15)
BUN: 6 mg/dL (ref 6–20)
CO2: 25 mmol/L (ref 22–32)
Calcium: 9.1 mg/dL (ref 8.9–10.3)
Chloride: 102 mmol/L (ref 98–111)
Creatinine, Ser: 0.53 mg/dL (ref 0.44–1.00)
GFR, Estimated: >60 mL/min (ref >60)
Glucose, Bld: 100 mg/dL — ABNORMAL HIGH (ref 70–99)
Potassium: 3.7 mmol/L (ref 3.5–5.1)
Sodium: 135 mmol/L (ref 135–145)
Total Bilirubin: 0.5 mg/dL (ref 0.3–1.2)
Total Protein: 7.9 g/dL (ref 6.5–8.1)

## 2020-11-02 LAB — URINALYSIS, ROUTINE W REFLEX MICROSCOPIC
Bilirubin Urine: NEGATIVE
Glucose, UA: NEGATIVE mg/dL
Hgb urine dipstick: NEGATIVE
Ketones, ur: NEGATIVE mg/dL
Leukocytes,Ua: NEGATIVE
Nitrite: NEGATIVE
Protein, ur: NEGATIVE mg/dL
Specific Gravity, Urine: 1.008 (ref 1.005–1.030)
pH: 6 (ref 5.0–8.0)

## 2020-11-02 LAB — WET PREP, GENITAL
Clue Cells Wet Prep HPF POC: NONE SEEN
Sperm: NONE SEEN
Trich, Wet Prep: NONE SEEN
Yeast Wet Prep HPF POC: NONE SEEN

## 2020-11-02 LAB — CBC
HCT: 40.7 % (ref 36.0–46.0)
Hemoglobin: 13.3 g/dL (ref 12.0–15.0)
MCH: 28.5 pg (ref 26.0–34.0)
MCHC: 32.7 g/dL (ref 30.0–36.0)
MCV: 87.2 fL (ref 80.0–100.0)
Platelets: 227 10*3/uL (ref 150–400)
RBC: 4.67 MIL/uL (ref 3.87–5.11)
RDW: 13 % (ref 11.5–15.5)
WBC: 7.3 10*3/uL (ref 4.0–10.5)
nRBC: 0 % (ref 0.0–0.2)

## 2020-11-02 LAB — POCT PREGNANCY, URINE
Preg Test, Ur: NEGATIVE
Preg Test, Ur: NEGATIVE

## 2020-11-02 LAB — LIPASE, BLOOD: Lipase: 32 U/L (ref 11–51)

## 2020-11-02 LAB — I-STAT BETA HCG BLOOD, ED (MC, WL, AP ONLY): I-stat hCG, quantitative: <5 m[IU]/mL (ref <5)

## 2020-11-02 NOTE — ED Notes (Signed)
Provider at bedside at this time with pt to discuss pt concerns at this time

## 2020-11-02 NOTE — Discharge Instructions (Addendum)
Recommend further evaluation in the MAU given reported pelvic pain that has continued after miscarriage. Needs transvaginal ultrasound.

## 2020-11-02 NOTE — ED Provider Notes (Signed)
Emergency Medicine Provider Triage Evaluation Note  April Boone , a 35 y.o. female  was evaluated in triage.  Pt complains of pelvic pain.  Review of Systems  Positive: Suprapubic and pelvic pain, urinary pressure Negative: Fever, n/v, dysuria, vaginal bleeding, vaginal discharge  Physical Exam  BP 115/86 (BP Location: Left Arm)   Pulse 80   Temp 98.2 F (36.8 C) (Oral)   Resp 16   Ht 5\' 3"  (1.6 m)   Wt 84 kg   LMP 10/16/2020 (Exact Date)   SpO2 100%   BMI 32.80 kg/m  Gen:   Awake, no distress   Resp:  Normal effort  MSK:   Moves extremities without difficulty  Other:  TTP suprapubic  Medical Decision Making  Medically screening exam initiated at 5:45 PM.  Appropriate orders placed.  Helena Shearn was informed that the remainder of the evaluation will be completed by another provider, this initial triage assessment does not replace that evaluation, and the importance of remaining in the ED until their evaluation is complete.  Has a miscarriage 3 months ago, has had recurrent lower abd pain since, worse in the past 3 days.     10/18/2020, PA-C 11/02/20 1746    01/02/21, MD 11/03/20 1134

## 2020-11-02 NOTE — MAU Note (Signed)
Pt stated she had been having pelvic pain off and for 3 months since her miscarriage. Has been seen in urgent care and given medicine the pain goes away for 3 days then come s back when the medicine is finished. Pt sent to urgent care today and they sent her to MAU for possible U/s to find source of pelvic pain. Pt reports she does have some pain and discomfort when she urinates

## 2020-11-02 NOTE — ED Triage Notes (Signed)
Pt arrives via MAU for eval of blt LQ abd pain x 3-4 days. Originally went to MAU for eval however not pregnant and was sent here for further eval

## 2020-11-02 NOTE — MAU Provider Note (Signed)
None     S Ms. April Boone is a 35 y.o. [id number] patient who presents to MAU today with complaint of pelvic pain off and on since her SAB 3 months ago..   O BP 118/71   Pulse 88   Temp 98 F (36.7 C)   Resp 18   LMP 10/16/2020 (Exact Date)  Physical Exam  A Medical screening exam complete   P Discharge from MAU in stable condition Patient given the option of transfer to [facility] for further evaluation or seek care in outpatient facility of choice Wants to go to [facility] and also get an appt w/[facility].  Message sent to call for an appt.    Jacklyn Shell, [place] 11/02/2020 4:16 PM

## 2020-11-02 NOTE — ED Triage Notes (Signed)
Pt reports ABD pain for 4 days. Pt reports she was given some med.but still has pain

## 2020-11-02 NOTE — ED Provider Notes (Signed)
[facility] EMERGENCY DEPARTMENT Provider Note   CSN: [id number] Arrival date & time: 11/02/20  1459     History Chief Complaint  Patient presents with   Abdominal Pain    April Boone is a 35 y.o. female who presents for evaluation of pelvic pain.  Patient primarily speaks Hausa so history is obtained with the assistance of an interpreter.  Of note, patient recently experienced a miscarriage in June of this year.  Since this time, she states that she is experienced intermittent episodes of low pelvic pain.  She states that she has had intermittent white vaginal discharge as well, and she was diagnosed with candidal vulvovaginitis on 6/27 and treated with Diflucan.  The symptoms improved initially but over the past 3 days she has noted worsening cramping pelvic pain, as well as low back pain.  Over this time, she has also noted scant white vaginal discharge.  No vaginal bleeding.  No fevers or chills.  She was seen in urgent care earlier today and advised to present to the emergency department for further evaluation.     Past Medical History:  Diagnosis Date   Obese     There are no problems to display for this patient.   History reviewed. No pertinent surgical history.   OB History     Gravida  4   Para  3   Term  3   Preterm      AB      Living  4      SAB      IAB      Ectopic      Multiple  1   Live Births  4           Family History  Problem Relation Age of Onset   Healthy Mother    Healthy Father     Social History   Tobacco Use   Smoking status: Never   Smokeless tobacco: Never  Vaping Use   Vaping Use: Never used  Substance Use Topics   Alcohol use: Not Currently   Drug use: Not Currently    Home Medications Prior to Admission medications   Medication Sig Start Date End Date Taking? Authorizing Provider  acetaminophen (TYLENOL) 500 MG tablet Take 1-2 tablets (500-1,000 mg total) by mouth every 6 (six)  hours as needed for headache. 07/30/20   Venora Maples, MD  famotidine (PEPCID) 20 MG tablet Take 1 tablet (20 mg total) by mouth 2 (two) times daily. 03/09/20   Mare Ferrari, PA-C  Prenatal Vit-Fe Fumarate-FA (PRENATAL VITAMINS) 28-0.8 MG TABS Take 1 tablet by mouth daily. Patient not taking: No sig reported 07/01/20   Rasch, Harolyn Rutherford, NP    Allergies    Patient has no known allergies.  Review of Systems   Review of Systems  Constitutional:  Negative for chills and fever.  HENT:  Negative for ear pain and sore throat.   Eyes:  Negative for pain and visual disturbance.  Respiratory:  Negative for cough and shortness of breath.   Cardiovascular:  Negative for chest pain and palpitations.  Gastrointestinal:  Negative for abdominal pain and vomiting.  Genitourinary:  Positive for pelvic pain and vaginal discharge. Negative for dysuria, frequency, hematuria and vaginal bleeding.  Musculoskeletal:  Negative for arthralgias and back pain.  Skin:  Negative for color change and rash.  Neurological:  Negative for seizures and syncope.  All other systems reviewed and are negative.  Physical Exam Updated Vital  Signs BP 115/69 (BP Location: Left Arm)   Pulse 66   Temp 98.1 F (36.7 C) (Oral)   Resp 16   Ht 5\' 3"  (1.6 m)   Wt 84 kg   LMP 10/16/2020 (Exact Date)   SpO2 100%   BMI 32.80 kg/m   Physical Exam Vitals and nursing note reviewed.  Constitutional:      General: She is not in acute distress.    Appearance: She is well-developed.  HENT:     Head: Normocephalic and atraumatic.  Eyes:     Conjunctiva/sclera: Conjunctivae normal.  Cardiovascular:     Rate and Rhythm: Normal rate and regular rhythm.     Heart sounds: No murmur heard. Pulmonary:     Effort: Pulmonary effort is normal. No respiratory distress.     Breath sounds: Normal breath sounds.  Abdominal:     Palpations: Abdomen is soft.     Tenderness: There is abdominal tenderness in the suprapubic area.   Genitourinary:    Vagina: Normal. No foreign body. No vaginal discharge, tenderness or bleeding.     Cervix: Normal.     Uterus: Normal.      Adnexa: Right adnexa normal and left adnexa normal.       Right: No mass, tenderness or fullness.         Left: No mass, tenderness or fullness.    Musculoskeletal:     Cervical back: Neck supple.  Skin:    General: Skin is warm and dry.  Neurological:     Mental Status: She is alert.    ED Results / Procedures / Treatments   Labs (all labs ordered are listed, but only abnormal results are displayed) Labs Reviewed  WET PREP, GENITAL - Abnormal; Notable for the following components:      Result Value   WBC, Wet Prep HPF POC FEW (*)    All other components within normal limits  COMPREHENSIVE METABOLIC PANEL - Abnormal; Notable for the following components:   Glucose, Bld 100 (*)    All other components within normal limits  URINALYSIS, ROUTINE W REFLEX MICROSCOPIC - Abnormal; Notable for the following components:   Color, Urine STRAW (*)    All other components within normal limits  LIPASE, BLOOD  CBC  POCT PREGNANCY, URINE  POCT PREGNANCY, URINE  I-STAT BETA HCG BLOOD, ED ([facility], [facility], [facility] ONLY)  GC/CHLAMYDIA PROBE AMP ([place]) NOT AT [facility]   EKG None  Radiology [facility] PELVIC COMPLETE W TRANSVAGINAL AND TORSION R/O  Result Date: 11/02/2020 CLINICAL DATA:  Abdominal and severe pelvic pain, R10.2, R10.9; LMP 10/16/2020 EXAM: TRANSABDOMINAL AND TRANSVAGINAL ULTRASOUND OF PELVIS DOPPLER ULTRASOUND OF OVARIES TECHNIQUE: Both transabdominal and transvaginal ultrasound examinations of the pelvis were performed. Transabdominal technique was performed for global imaging of the pelvis including uterus, ovaries, adnexal regions, and pelvic cul-de-sac. It was necessary to proceed with endovaginal exam following the transabdominal exam to visualize the endometrium and adnexa. Color and duplex Doppler ultrasound was utilized to evaluate blood flow to the  ovaries. COMPARISON:  None FINDINGS: Uterus Measurements: 7.1 x 5.8 x 6.6 cm = volume: 142 mL. Anteflexed. Normal morphology without mass Endometrium Thickness: 11 mm. Trace fluid in endocervical canal. Nodule identified within endometrium 8 x 6 x 8 mm, suspicious for endometrial polyp. Right ovary Measurements: 3.2 x 2.9 x 2.3 cm = volume: 10.1 mL. Normal morphology without mass. Internal blood flow present on color Doppler imaging. Left ovary Measurements: 2.9 x 1.8 x 2.0 cm = volume:  5.2 mL. Seen only on transabdominal imaging. Normal morphology without mass. Blood flow present on color Doppler imaging. Pulsed Doppler evaluation of both ovaries demonstrates normal low-resistance arterial and venous waveforms. Other findings Trace free pelvic fluid.  No adnexal masses. IMPRESSION: Suspected 8 mm endometrial polyp; consider sonohysterogram for further evaluation, prior to hysteroscopy or endometrial biopsy. Otherwise normal appearance of uterus and ovaries. No evidence of ovarian mass or torsion. Electronically Signed   By: Ulyses Southward M.D.   On: 11/02/2020 20:33    Procedures Procedures   Medications Ordered in ED Medications - No data to display  ED Course  I have reviewed the triage vital signs and the nursing notes.  Pertinent labs & imaging results that were available during my care of the patient were reviewed by me and considered in my medical decision making (see chart for details).    MDM Rules/Calculators/A&P                           35 y.o. female with past medical history as above who presents for evaluation of pelvic pain and vaginal discharge.  Afebrile and hemodynamically stable.  Exam is notable for mild suprapubic tenderness to palpation.  Pelvic exam was performed and unremarkable.  CBC obtained and normal, without leukocytosis or left shift.  CMP unremarkable.  Pregnancy testing was negative.  Urinalysis without evidence of UTI or nephrolithiasis.  Wet prep with few WBCs but no  other findings.  GC/chlamydia testing pending.  I have low suspicion for STI at this time, so we will follow culture data rather than treat empirically at this time.  I have low suspicion for appendicitis, with more concern for pelvic etiology of symptoms.  Pelvic ultrasound was obtained to rule out TOA, torsion, ovarian mass, which demonstrates a 8 mm endometrial polyp, with otherwise normal appearance of uterus and ovaries.  No evidence of ovarian mass or torsion.  Results were discussed with patient, who will follow-up with OB/GYN for further evaluation.  Patient was counseled to continue symptomatic treatment with Tylenol and ibuprofen.  Patient suitable for discharge.  Final Clinical Impression(s) / ED Diagnoses Final diagnoses:  Pelvic pain  Abdominal pain    Rx / DC Orders ED Discharge Orders     None        Holley Dexter, MD 11/03/20 1400    Milagros Loll, MD 11/03/20 1534

## 2020-11-02 NOTE — ED Notes (Signed)
Received verbal report from Crystal B RN at this time 

## 2020-11-02 NOTE — ED Notes (Signed)
While attempting to d/c pt pt starts asking about test results and asking for a prescription for antibiotics states that the last time that she was seen they gave it to her and it made her feel better. Advised pt that they are not going to give her antibiotics unless she has an infection. Went and spoke with provider and made aware.

## 2020-11-02 NOTE — ED Notes (Signed)
Provider at bedside at this time

## 2020-11-02 NOTE — ED Notes (Signed)
thur interpreter Pt has been here 4 times and still has ABD pain

## 2020-11-02 NOTE — ED Provider Notes (Signed)
[facility]    CSN: [id number] Arrival date & time: 11/02/20  1347      History   Chief Complaint Chief Complaint  Patient presents with   Abdominal Pain    HPI April Boone is a 35 y.o. female.   Patient here today for evaluation of recurrent pelvic pain following miscarriage about 3 months ago.  She reports her most current pain has been present for about 3 days.  She describes the pain in her pelvic and vaginal area.  She denies any vaginal bleeding but does admit to some vaginal discharge that is thin and white.  She reports that she was prescribed a medication recently, but that pain returned.  She was instructed to go to the gynecologist at that time, but states she was instructed she did not need to go given medication prescribed.  The history is provided by the patient. The history is limited by a language barrier. A language interpreter was used.   Past Medical History:  Diagnosis Date   Obese     There are no problems to display for this patient.   History reviewed. No pertinent surgical history.  OB History     Gravida  4   Para  3   Term  3   Preterm      AB      Living  4      SAB      IAB      Ectopic      Multiple  1   Live Births  4            Home Medications    Prior to Admission medications   Medication Sig Start Date End Date Taking? Authorizing Provider  acetaminophen (TYLENOL) 500 MG tablet Take 1-2 tablets (500-1,000 mg total) by mouth every 6 (six) hours as needed for headache. 07/30/20   Venora Maples, MD  famotidine (PEPCID) 20 MG tablet Take 1 tablet (20 mg total) by mouth 2 (two) times daily. 03/09/20   Mare Ferrari, PA-C  Prenatal Vit-Fe Fumarate-FA (PRENATAL VITAMINS) 28-0.8 MG TABS Take 1 tablet by mouth daily. Patient not taking: No sig reported 07/01/20   Rasch, Harolyn Rutherford, NP    Family History Family History  Problem Relation Age of Onset   Healthy Mother    Healthy Father     Social  History Social History   Tobacco Use   Smoking status: Never   Smokeless tobacco: Never  Vaping Use   Vaping Use: Never used  Substance Use Topics   Alcohol use: Not Currently   Drug use: Not Currently     Allergies   Patient has no known allergies.   Review of Systems Review of Systems  Constitutional:  Negative for chills and fever.  Eyes:  Negative for discharge and redness.  Gastrointestinal:  Negative for abdominal pain, nausea and vomiting.  Genitourinary:  Positive for pelvic pain, vaginal discharge and vaginal pain. Negative for vaginal bleeding.    Physical Exam Triage Vital Signs ED Triage Vitals  Enc Vitals Group     BP 11/02/20 1415 119/77     Pulse Rate 11/02/20 1415 87     Resp 11/02/20 1415 16     Temp 11/02/20 1415 98 F (36.7 C)     Temp src --      SpO2 11/02/20 1415 98 %     Weight --      Height --      Head  Circumference --      Peak Flow --      Pain Score 11/02/20 1417 8     Pain Loc --      Pain Edu? --      Excl. in GC? --    No data found.  Updated Vital Signs BP 119/77   Pulse 87   Temp 98 F (36.7 C)   Resp 16   LMP 10/16/2020 (Exact Date)   SpO2 98%      Physical Exam Constitutional:      Appearance: She is well-developed. She is not ill-appearing.     Comments: Mild distress appears to be in pain  HENT:     Head: Normocephalic and atraumatic.  Cardiovascular:     Rate and Rhythm: Normal rate and regular rhythm.     Heart sounds: Normal heart sounds.  Pulmonary:     Effort: Pulmonary effort is normal. No respiratory distress.     Breath sounds: Normal breath sounds. No wheezing, rhonchi or rales.  Abdominal:     General: Abdomen is flat. Bowel sounds are normal. There is no distension.     Palpations: Abdomen is soft.     Comments: Mild tenderness to palpation to suprapubic area  Skin:    General: Skin is warm.  Neurological:     Mental Status: She is alert.  Psychiatric:        Mood and Affect: Mood normal.         Behavior: Behavior normal.     UC Treatments / Results  Labs (all labs ordered are listed, but only abnormal results are displayed) Labs Reviewed - No data to display  EKG   Radiology No results found.  Procedures Procedures (including critical care time)  Medications Ordered in UC Medications - No data to display  Initial Impression / Assessment and Plan / UC Course  I have reviewed the triage vital signs and the nursing notes.  Pertinent labs & imaging results that were available during my care of the patient were reviewed by me and considered in my medical decision making (see chart for details).  Given recurrent symptoms despite use of Diflucan for yeast infection, recommended further evaluation in the emergency room with transvaginal ultrasound.  Discussed option of outpatient management through GYN, but patient reports she is in significant pain today.  Patient is agreeable with plan to go to the emergency department.  Final Clinical Impressions(s) / UC Diagnoses   Final diagnoses:  Pelvic pain in female     Discharge Instructions      Recommend further evaluation in the MAU given reported pelvic pain that has continued after miscarriage. Needs transvaginal ultrasound.     ED Prescriptions   None    PDMP not reviewed this encounter.   Tomi Bamberger, PA-C 11/02/20 1452

## 2020-11-02 NOTE — ED Notes (Signed)
Provider at bedside to complete pelvic exam at this time

## 2020-11-05 LAB — GC/CHLAMYDIA PROBE AMP (~~LOC~~) NOT AT ARMC
Chlamydia: NEGATIVE
Comment: NEGATIVE
Comment: NORMAL
Neisseria Gonorrhea: NEGATIVE

## 2020-12-05 ENCOUNTER — Encounter (HOSPITAL_COMMUNITY): Payer: Self-pay | Admitting: Radiology

## 2021-01-22 ENCOUNTER — Other Ambulatory Visit: Payer: Self-pay

## 2021-01-22 ENCOUNTER — Ambulatory Visit (INDEPENDENT_AMBULATORY_CARE_PROVIDER_SITE_OTHER): Payer: Self-pay | Admitting: Obstetrics & Gynecology

## 2021-01-22 ENCOUNTER — Encounter: Payer: Self-pay | Admitting: Obstetrics & Gynecology

## 2021-01-22 VITALS — BP 134/84 | HR 84 | Ht 65.0 in | Wt 199.3 lb

## 2021-01-22 DIAGNOSIS — R102 Pelvic and perineal pain: Secondary | ICD-10-CM

## 2021-01-22 MED ORDER — NAPROXEN 500 MG PO TABS: 500.0000 mg | ORAL_TABLET | Freq: Two times a day (BID) | ORAL | 2 refills | Status: DC

## 2021-01-22 NOTE — Progress Notes (Signed)
GYNECOLOGY OFFICE VISIT NOTE  History:   April Boone is a 35 y.o. [id number] here today for discussion about her abdominal pain.  History of SAB in 06/2020, reports having intermittent pelvic pain since then worsening during periods. Tylenol does not help.  Long history of evaluation for abdominal pain in other ED visits since last year. She was evaluated in ER in 10/2020, had negative infection screening and ultrasound was only remarkable for possible small endometrial polyp. Has regular periods, no heavy bleeding noted; she actually says her periods have been lighter since miscarriage. The miscarriage was spontaneous, no procedures were done.  She denies any current abnormal vaginal discharge, bleeding, pelvic pain or other concerns.  Desires future pregnancy.    Past Medical History:  Diagnosis Date   Obese     History reviewed. No pertinent surgical history.  The following portions of the patient's history were reviewed and updated as appropriate: allergies, current medications, past family history, past medical history, past social history, past surgical history and problem list.   Health Maintenance:  Normal pap and negative HRHPV on 07/31/2019.  Review of Systems:  Pertinent items noted in HPI and remainder of comprehensive ROS otherwise negative.  Physical Exam:  BP 134/84   Pulse 84   Ht 5\' 5"  (1.651 m)   Wt 199 lb 4.8 oz (90.4 kg)   LMP 01/11/2021 (Exact Date)   BMI 33.17 kg/m  CONSTITUTIONAL: Well-developed, well-nourished female in no acute distress.  HEENT:  Normocephalic, atraumatic. External right and left ear normal. No scleral icterus.  NECK: Normal range of motion, supple, no masses noted on observation SKIN: No rash noted. Not diaphoretic. No erythema. No pallor. MUSCULOSKELETAL: Normal range of motion. No edema noted. NEUROLOGIC: Alert and oriented to person, place, and time. Normal muscle tone coordination. No cranial nerve deficit noted. PSYCHIATRIC: Normal  mood and affect. Normal behavior. Normal judgment and thought content. CARDIOVASCULAR: Normal heart rate noted RESPIRATORY: Effort and breath sounds normal, no problems with respiration noted ABDOMEN: No masses noted. No other overt distention noted.  No current tenderness, points to lower abdomen as point of tenderness when it happens. PELVIC: Deferred  Labs and Imaging 13/01/2021 PELVIC COMPLETE W TRANSVAGINAL AND TORSION R/O  Result Date: 11/02/2020 CLINICAL DATA:  Abdominal and severe pelvic pain, R10.2, R10.9; LMP 10/16/2020 EXAM: TRANSABDOMINAL AND TRANSVAGINAL ULTRASOUND OF PELVIS DOPPLER ULTRASOUND OF OVARIES TECHNIQUE: Both transabdominal and transvaginal ultrasound examinations of the pelvis were performed. Transabdominal technique was performed for global imaging of the pelvis including uterus, ovaries, adnexal regions, and pelvic cul-de-sac. It was necessary to proceed with endovaginal exam following the transabdominal exam to visualize the endometrium and adnexa. Color and duplex Doppler ultrasound was utilized to evaluate blood flow to the ovaries. COMPARISON:  None FINDINGS: Uterus Measurements: 7.1 x 5.8 x 6.6 cm = volume: 142 mL. Anteflexed. Normal morphology without mass Endometrium Thickness: 11 mm. Trace fluid in endocervical canal. Nodule identified within endometrium 8 x 6 x 8 mm, suspicious for endometrial polyp. Right ovary Measurements: 3.2 x 2.9 x 2.3 cm = volume: 10.1 mL. Normal morphology without mass. Internal blood flow present on color Doppler imaging. Left ovary Measurements: 2.9 x 1.8 x 2.0 cm = volume: 5.2 mL. Seen only on transabdominal imaging. Normal morphology without mass. Blood flow present on color Doppler imaging. Pulsed Doppler evaluation of both ovaries demonstrates normal low-resistance arterial and venous waveforms. Other findings Trace free pelvic fluid.  No adnexal masses. IMPRESSION: Suspected 8 mm endometrial polyp; consider sonohysterogram for  further evaluation,  prior to hysteroscopy or endometrial biopsy. Otherwise normal appearance of uterus and ovaries. No evidence of ovarian mass or torsion. Electronically Signed   By: Ulyses Southward M.D.   On: 11/02/2020 20:33       Assessment and Plan:    1. Pelvic pain in female Reviewed ultrasound findings, no structural etiology seen. Reassured patient.  Recommend Naproxen and Tylenol as needed for pain. In talking to her, it seems that there is a significant psychosomatic portion of her pain, she is grieving for her miscarriage. Offered counseling, she declined saying she "feels okay".  Pain will likely not improve until she conceives again. At the end of encounter, she was asking for medication to help her become pregnant. She was assured that she has normal periods, 4 living children, no problems with conception noted for now. She is taking prenatal vitamins. She is to let us know about worsening symptoms or other concerns.  - naproxen (NAPROSYN) 500 MG tablet; Take 1 tablet (500 mg total) by mouth 2 (two) times daily with a meal.  Dispense: 30 tablet; Refill: 2  Routine preventative health maintenance measures emphasized. Please refer to After Visit Summary for other counseling recommendations.   Return for any gynecologic concerns.    I spent 25 minutes dedicated to the care of this patient including pre-visit review of records, face to face time with the patient discussing her conditions and treatments and post visit orders.    Jaynie Collins, MD, FACOG Obstetrician & Gynecologist, [facility] for [employer], [facility]

## 2021-03-15 ENCOUNTER — Encounter (HOSPITAL_COMMUNITY): Payer: Self-pay

## 2021-03-15 ENCOUNTER — Ambulatory Visit (HOSPITAL_COMMUNITY)
Admission: EM | Admit: 2021-03-15 | Discharge: 2021-03-15 | Disposition: A | Discharge status: Home / Self Care | Payer: Self-pay | Attending: Family Medicine | Admitting: Family Medicine

## 2021-03-15 ENCOUNTER — Other Ambulatory Visit: Payer: Self-pay

## 2021-03-15 DIAGNOSIS — R519 Headache, unspecified: Secondary | ICD-10-CM

## 2021-03-15 DIAGNOSIS — N926 Irregular menstruation, unspecified: Secondary | ICD-10-CM

## 2021-03-15 LAB — POCT URINALYSIS DIPSTICK, ED / UC
Bilirubin Urine: NEGATIVE
Glucose, UA: NEGATIVE mg/dL
Hgb urine dipstick: NEGATIVE
Ketones, ur: NEGATIVE mg/dL
Leukocytes,Ua: NEGATIVE
Nitrite: NEGATIVE
Protein, ur: NEGATIVE mg/dL
Specific Gravity, Urine: 1.01 (ref 1.005–1.030)
Urobilinogen, UA: 0.2 mg/dL (ref 0.0–1.0)
pH: 5.5 (ref 5.0–8.0)

## 2021-03-15 LAB — POC URINE PREG, ED: Preg Test, Ur: NEGATIVE

## 2021-03-15 MED ORDER — KETOROLAC TROMETHAMINE 60 MG/2ML IM SOLN
INTRAMUSCULAR | Status: AC
  Filled 2021-03-15: qty 2

## 2021-03-15 MED ORDER — KETOROLAC TROMETHAMINE 60 MG/2ML IM SOLN
60.0000 mg | Freq: Once | INTRAMUSCULAR | Status: AC
  Administered 2021-03-15: 60 mg via INTRAMUSCULAR

## 2021-03-15 NOTE — Discharge Instructions (Signed)
Your pregnancy test and urine sample were negative. You are not pregnant.

## 2021-03-17 NOTE — ED Provider Notes (Signed)
[facility]   [id number] 03/15/21 Arrival Time: 1501  ASSESSMENT & PLAN:  1. Acute intractable headache, unspecified headache type   2. Menstrual period late    Meds ordered this encounter  Medications   ketorolac (TORADOL) injection 60 mg   Normal neurological exam. Without fever, focal neuro logical deficits, nuchal rigidity, or change in vision. No indication for neurodiagnostic workup at this time. Discussed.d U/A unremarkable. UPT negative.  Recommend:  Follow-up Information     [name] EMERGENCY DEPARTMENT.   Specialty: Emergency Medicine Why: If symptoms worsen in any way. Contact information: [address] [id number] mc [name] [address] [phone]        [name] [name].   Specialty: Urgent Care Why: If worsening or failing to improve as anticipated. Contact information: [address] [name] [address] [phone]                 Reviewed expectations re: course of current medical issues. Questions answered. Outlined signs and symptoms indicating need for more acute intervention. Patient verbalized understanding. After Visit Summary given.   SUBJECTIVE: History from: Patient Patient is able to give a clear and coherent history.  April Boone is a 36 y.o. female who presents with complaint of a migraine headache. Onset gradual, past 1-2 days. Location: frontal without radiation. History of headaches: yes; same symptoms. Precipitating factors include: none which have been determined. Associated symptoms: Preceding aura: no. Nausea/vomiting: mild nausea. Vision changes: no. Increased sensitivity to light and to noises: yes. Fever: no. Sinus pressure/congestion: no. Extremity weakness: no. Home treatment has included  Tylenol  with little improvement. Current headache has limited normal daily activities. Denies loss of balance, muscle  weakness, numbness of extremities, and vision problems. No head injury reported. Ambulatory without difficulty. No recent travel.   OBJECTIVE:  Vitals:   03/15/21 1615  BP: (!) 133/58  Pulse: 81  Resp: 18  Temp: 98.3 F (36.8 C)  TempSrc: Oral  SpO2: 99%    General appearance: alert; fatigued-aippearing HENT: normocephalic; atraumatic Eyes: PERRLA; EOMI; conjunctivae normal Neck: supple with FROM Lungs: clear to auscultation bilaterally; unlabored respirations Heart: regular rate and rhythm Extremities: no edema; symmetrical with no gross deformities Skin: warm and dry Neurologic: alert; speech is fluent and clear without dysarthria or aphasia; CN 2-12 grossly intact; no facial droop; normal gait; normal symmetric reflexes; normal extremity strength and sensation throughout; bilateral upper and lower extremity sensation is grossly intact with 5/5 symmetric strength; normal grip strength; normal plantar and dorsi flexion bilaterally; normal finger to nose bilaterally; negative pronator drift; negative Romberg sign Psychological: alert and cooperative; normal mood and affect  Labs: Results for orders placed or performed during the Bo[facility] encounter of 03/15/21  POC urine pregnancy  Result Value Ref Range   Preg Test, Ur NEGATIVE NEGATIVE  POC Urinalysis dipstick  Result Value Ref Range   Glucose, UA NEGATIVE NEGATIVE mg/dL   Bilirubin Urine NEGATIVE NEGATIVE   Ketones, ur NEGATIVE NEGATIVE mg/dL   Specific Gravity, Urine 1.010 1.005 - 1.030   Hgb urine dipstick NEGATIVE NEGATIVE   pH 5.5 5.0 - 8.0   Protein, ur NEGATIVE NEGATIVE mg/dL   Urobilinogen, UA 0.2 0.0 - 1.0 mg/dL   Nitrite NEGATIVE NEGATIVE   Leukocytes,Ua NEGATIVE NEGATIVE   Labs Reviewed  POC URINE PREG, ED  POCT URINALYSIS DIPSTICK, ED / UC    No Known Allergies  Past Medical History:  Diagnosis Date   Obese  Social History   Socioeconomic History   Marital status: Single    Spouse name: Not  on file   Number of children: Not on file   Years of education: Not on file   Highest education level: Not on file  Occupational History   Not on file  Tobacco Use   Smoking status: Never   Smokeless tobacco: Never  Vaping Use   Vaping Use: Never used  Substance and Sexual Activity   Alcohol use: Not Currently   Drug use: Not Currently   Sexual activity: Not Currently    Birth control/protection: None  Other Topics Concern   Not on file  Social History Narrative   Not on file   Social Determinants of Health   Financial Resource Strain: Not on file  Food Insecurity: Not on file  Transportation Needs: Not on file  Physical Activity: Not on file  Stress: Not on file  Social Connections: Not on file  Intimate Partner Violence: Not on file   Family History  Problem Relation Age of Onset   Healthy Mother    Healthy Father    History reviewed. No pertinent surgical history.    April Layman, MD 03/17/21 [phone]

## 2021-12-01 IMAGING — CT CT ABD-PELV W/ CM
2 of 4 series · 16 of 46 positions shown, 18 images · IV contrast (APPLIED)
Comparison: None.

CLINICAL DATA: 34-year-old female with epigastric pain.

EXAM:
CT ABDOMEN AND PELVIS WITH CONTRAST
TECHNIQUE: Multidetector CT imaging of the abdomen and pelvis was performed
using the standard protocol following bolus administration of
intravenous contrast.
CONTRAST:  100mL OMNIPAQUE IOHEXOL 300 MG/ML  SOLN

[Series 3: abdomen 5.0 · axial · 0.72mm/px · z∈[+869,+1274]mm · 13 of 91 slices shown, 15 images]
[im 5/91  soft-tissue]
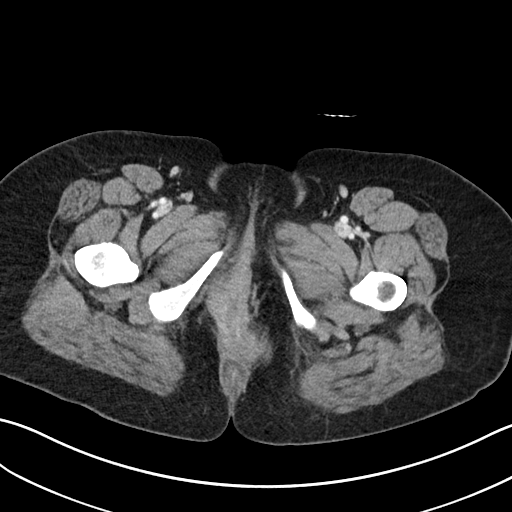
[im 5/91  bone]
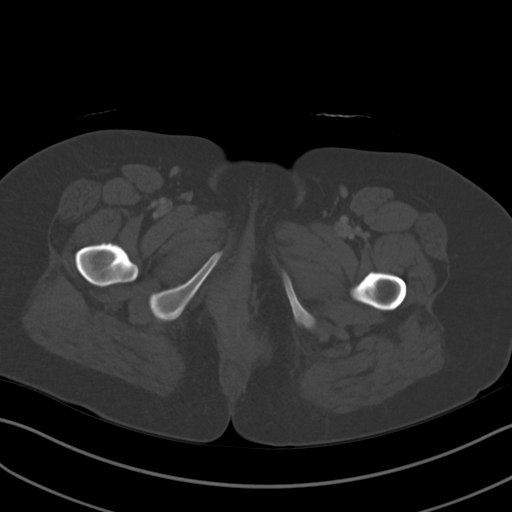
[im 13/91  soft-tissue]
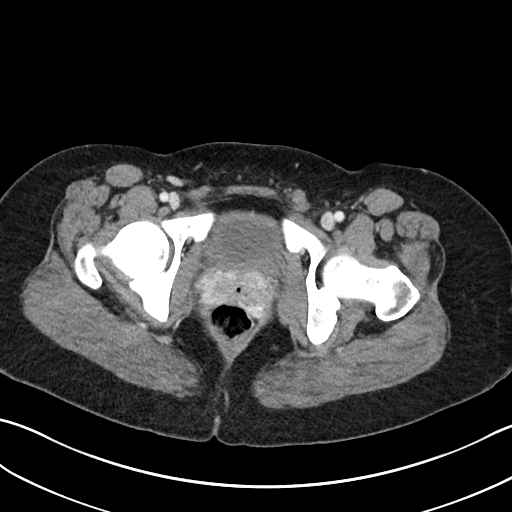
[im 18/91  soft-tissue]
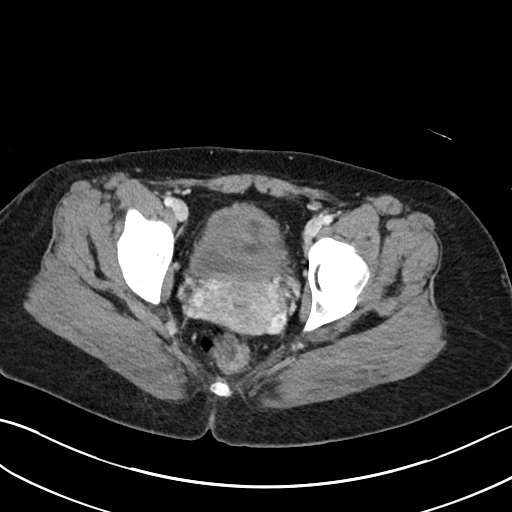
[im 26/91  soft-tissue]
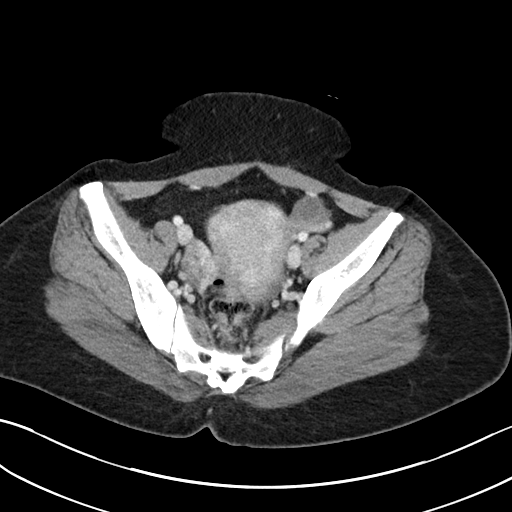
[im 31/91  soft-tissue]
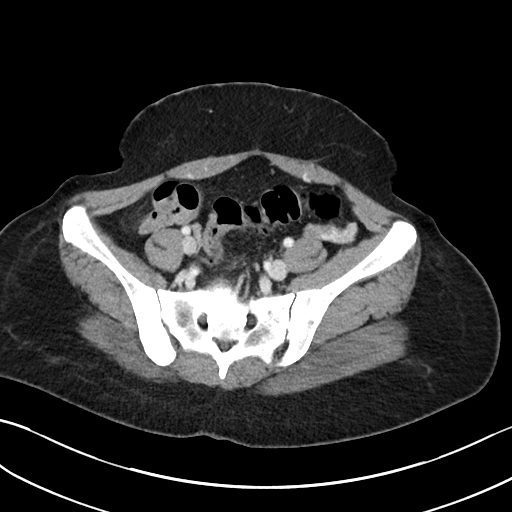
[im 39/91  soft-tissue]
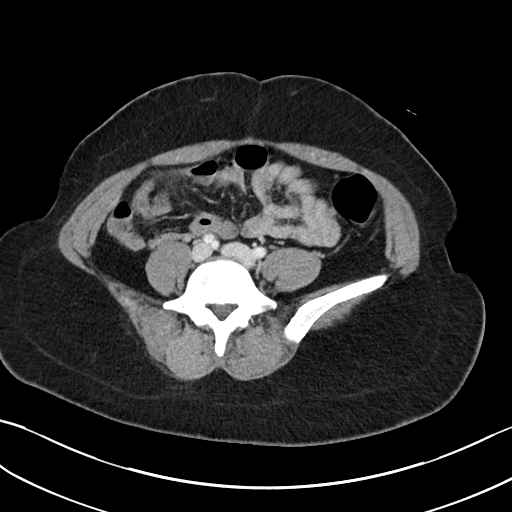
[im 48/91  soft-tissue]
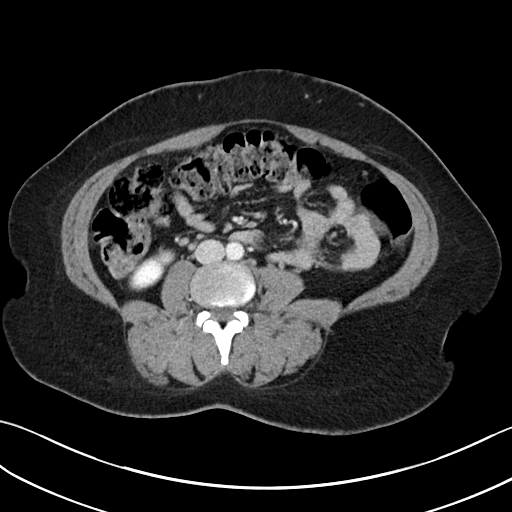
[im 52/91  soft-tissue]
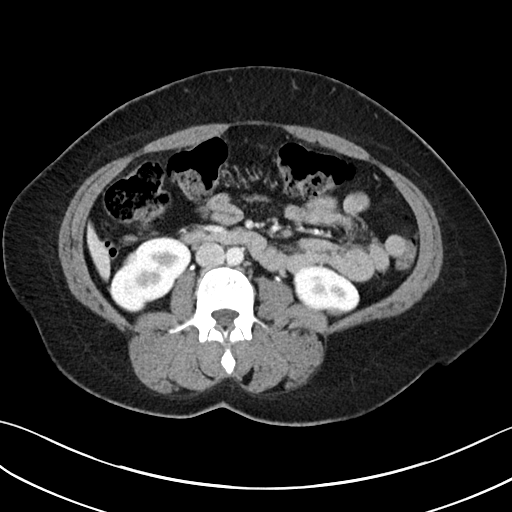
[im 61/91  soft-tissue]
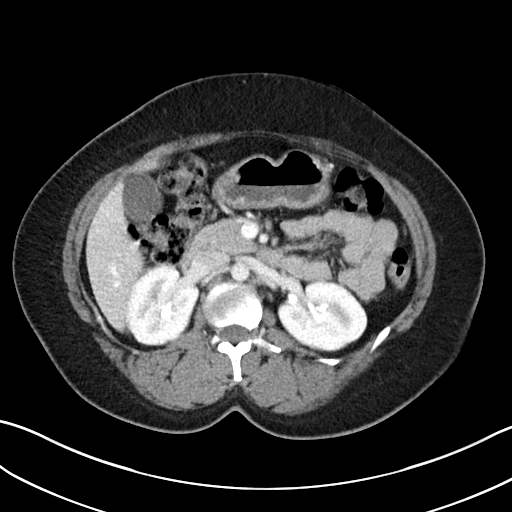
[im 61/91  bone]
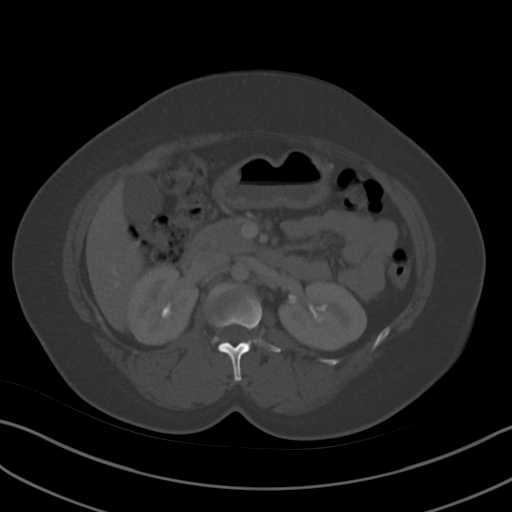
[im 65/91  soft-tissue]
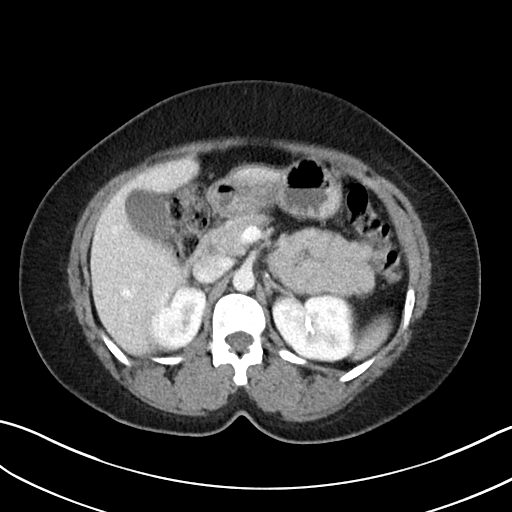
[im 73/91  soft-tissue]
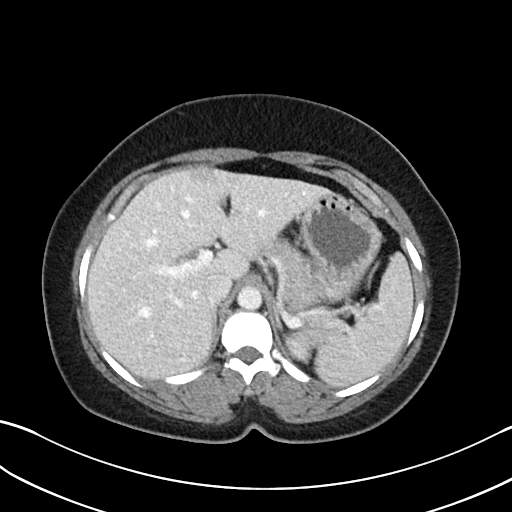
[im 78/91  soft-tissue]
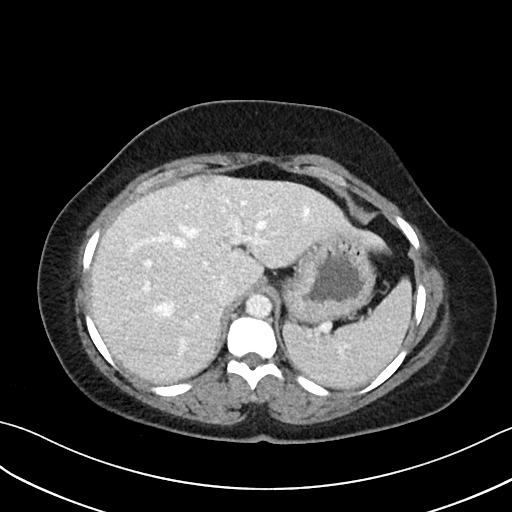
[im 86/91  soft-tissue]
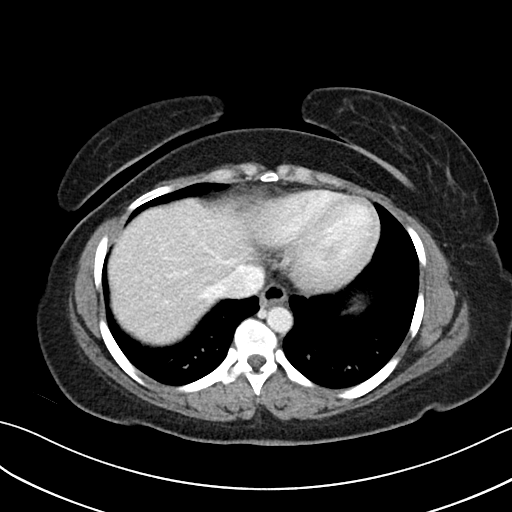

[Series 6: abdomen 3.0 mpr cor · coronal · 0.76mm/px · 3 of 93 slices shown]
[im 31/93  soft-tissue]
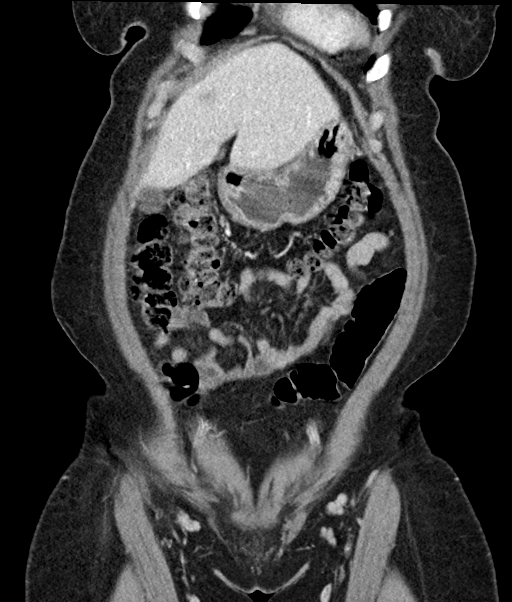
[im 41/93  soft-tissue]
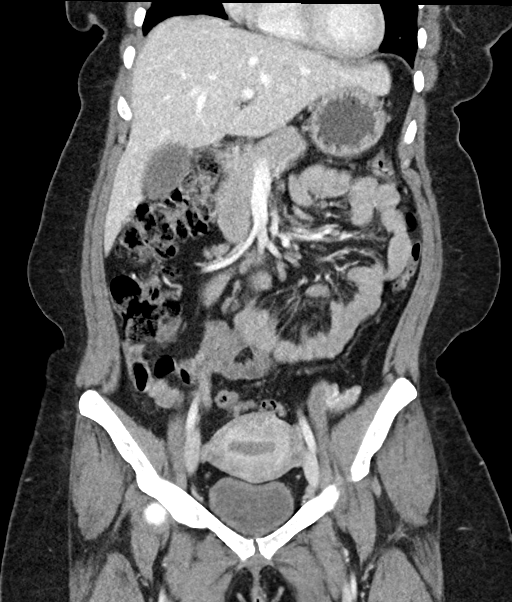
[im 52/93  soft-tissue]
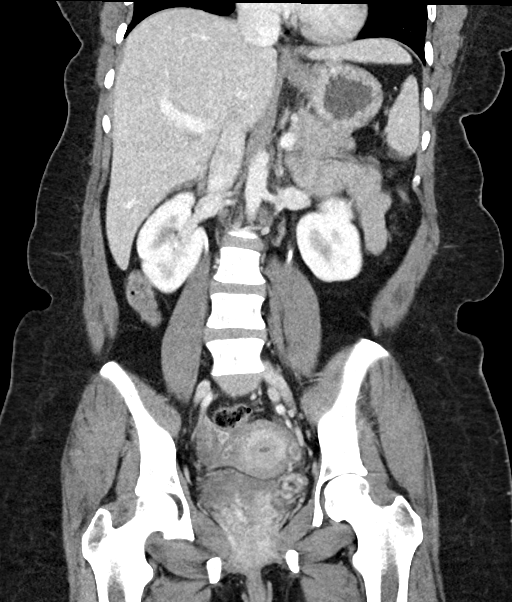

[16 of 46 positions shown; findings below may reference images not displayed]

FINDINGS: Lower chest: The visualized lung bases are clear.

No intra-abdominal free air or free fluid.

Hepatobiliary: No focal liver abnormality is seen. No gallstones,
gallbladder wall thickening, or biliary dilatation.

Pancreas: Unremarkable. No pancreatic ductal dilatation or
surrounding inflammatory changes.

Spleen: Normal in size without focal abnormality.

Adrenals/Urinary Tract: Adrenal glands are unremarkable. Kidneys are
normal, without renal calculi, focal lesion, or hydronephrosis.
Bladder is unremarkable.

Stomach/Bowel: There is no bowel obstruction or active inflammation.
The appendix is normal.

Vascular/Lymphatic: The abdominal aorta and IVC are unremarkable. No
portal venous gas. There is no adenopathy.

Reproductive: The uterus is anteverted and grossly unremarkable. A 2
cm left ovarian dominant follicle. The right ovary is unremarkable.

Other: None

Musculoskeletal: No acute or significant osseous findings.
IMPRESSION: 1. No acute intra-abdominal or pelvic pathology. No bowel
obstruction. Normal appendix.
2. A 2 cm left ovarian dominant follicle.

## 2022-04-05 IMAGING — US US OB TRANSVAGINAL
1 series · 15 of 28 positions shown · non-contrast
Comparison: Ultrasound Ob July 06, 2020

CLINICAL DATA: Pain for past 2 hours. Gestational age by last
menstrual period of 6 weeks and 6 days. Estimated due date by last
menstrual period 03/01/2021. Last menstrual period 05/25/2020.
Decreased beta quantitative HCG of 1,316 from 1,956

EXAM:
OBSTETRIC <14 WK US AND TRANSVAGINAL OB US
TECHNIQUE: Both transabdominal and transvaginal ultrasound examinations were
performed for complete evaluation of the gestation as well as the
maternal uterus, adnexal regions, and pelvic cul-de-sac.
Transvaginal technique was performed to assess early pregnancy.

[Series 1: us ob transvaginal · 15 of 33 slices shown]
[im 1/33]
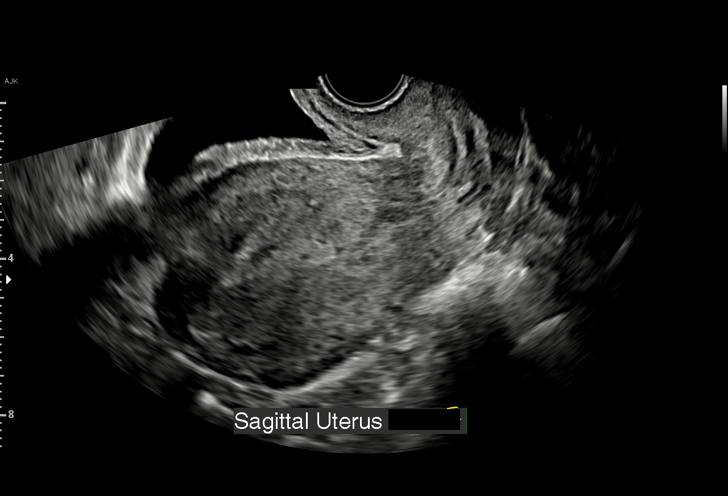
[im 3/33]
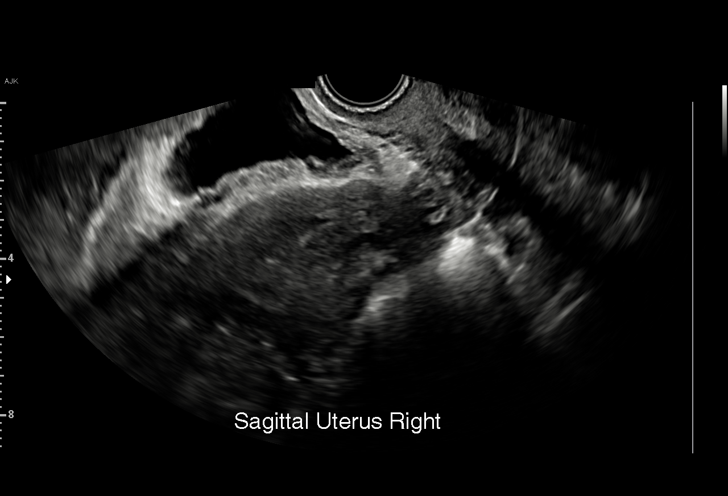
[im 5/33]
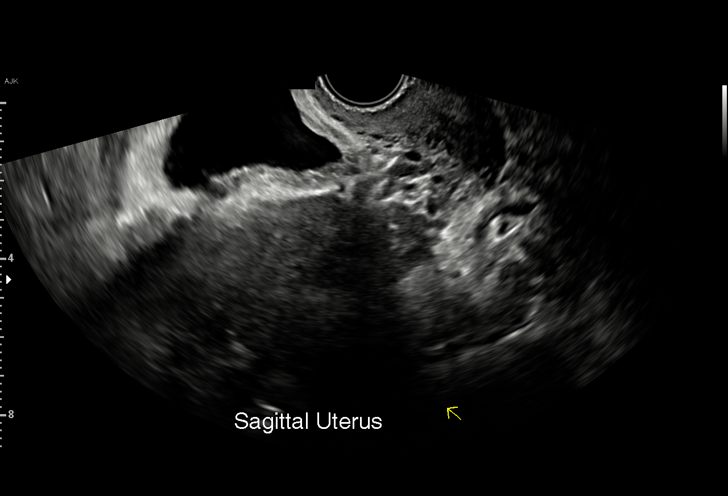
[im 8/33]
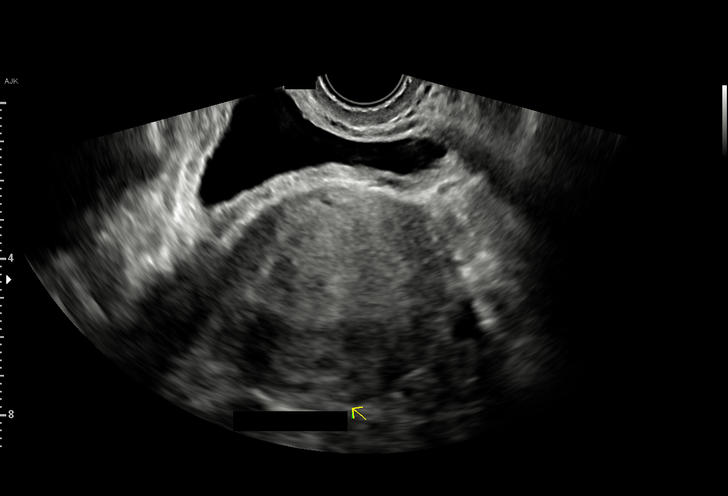
[im 10/33]
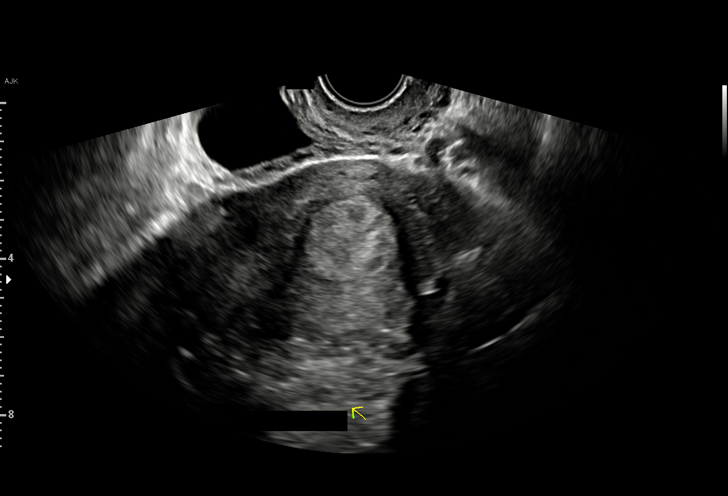
[im 12/33]
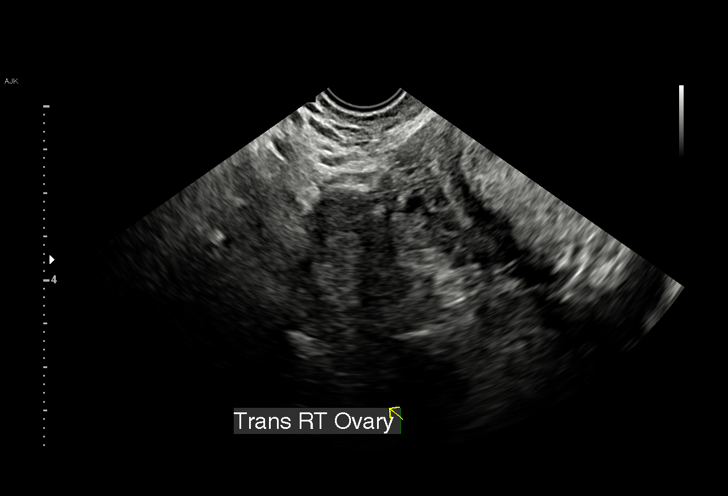
[im 15/33]
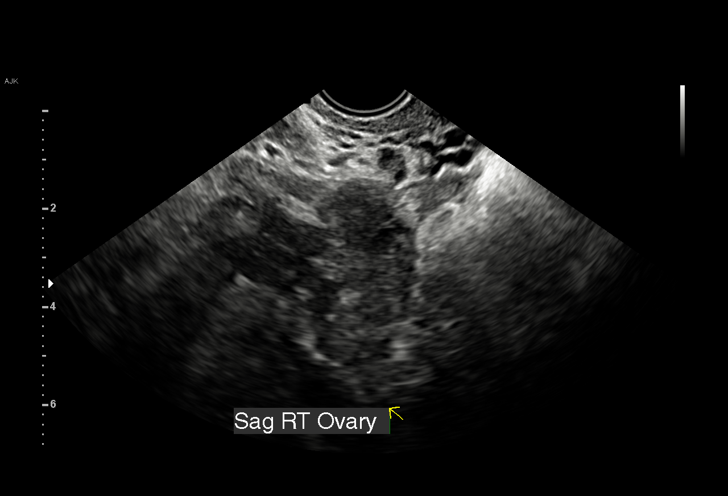
[im 17/33]
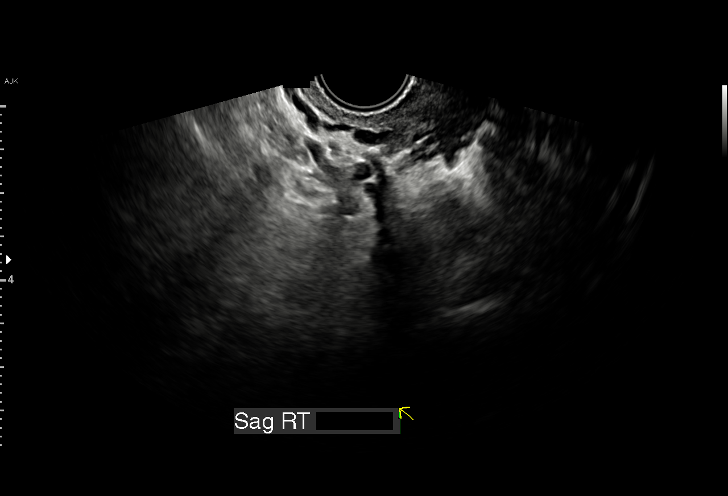
[im 18/33]
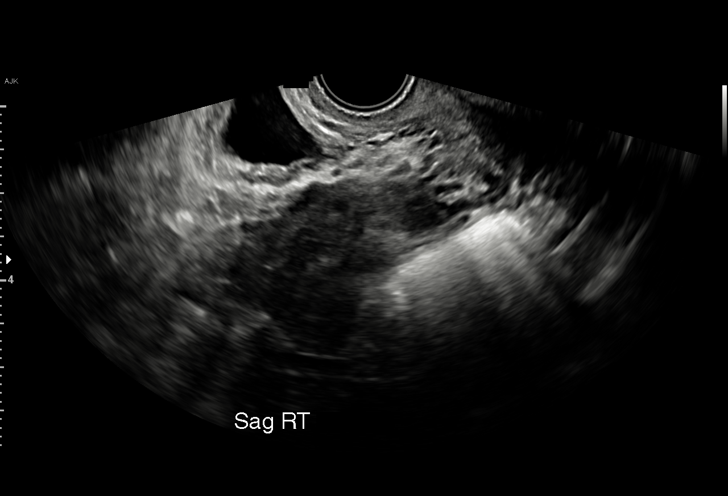
[im 21/33]
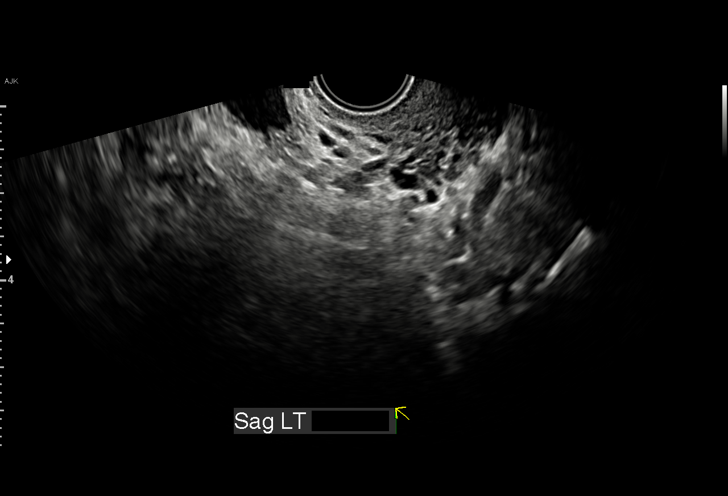
[im 23/33]
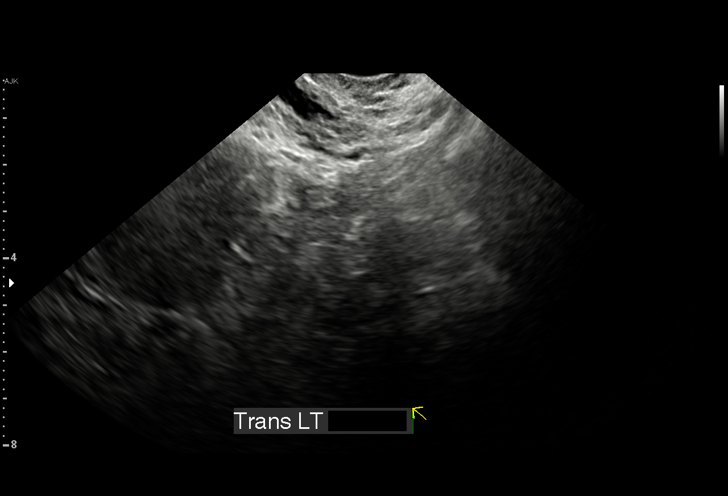
[im 25/33]
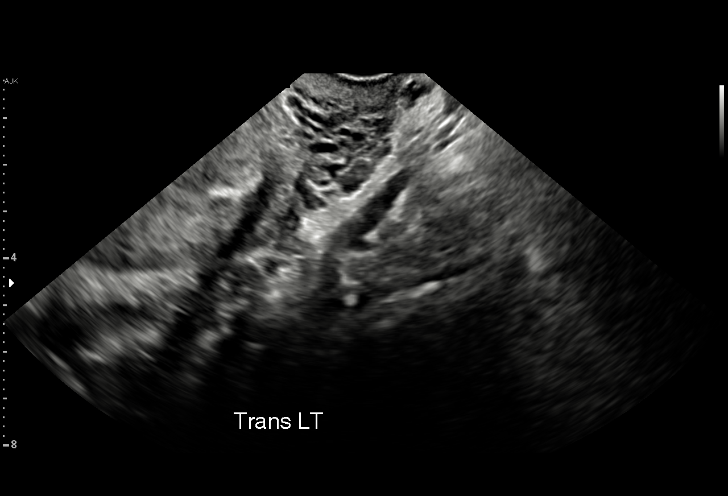
[im 28/33]
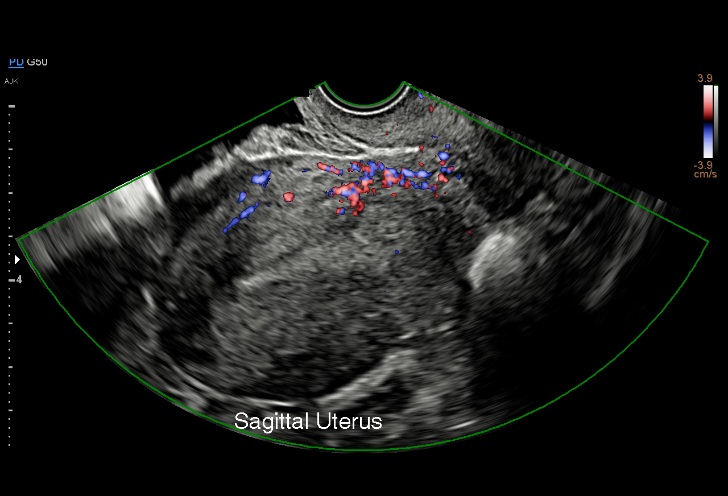
[im 30/33]
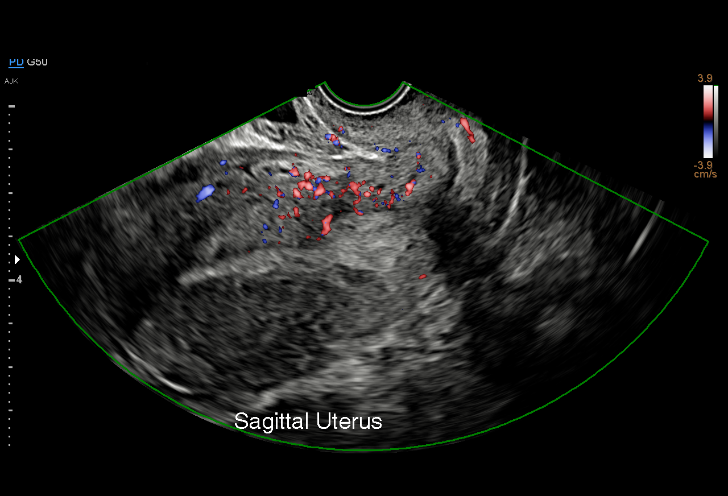
[im 33/33]
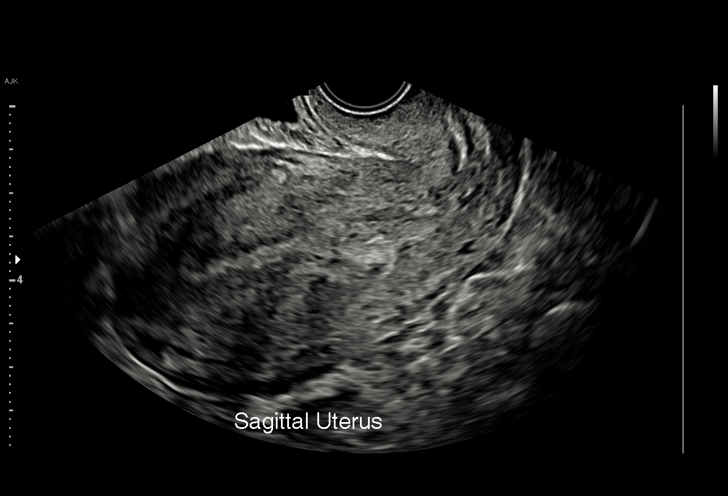

[15 of 28 positions shown; findings below may reference images not displayed]

FINDINGS: Intrauterine gestational sac: None. Previously identified
gestational sac is no longer visualized.

Yolk sac:  Not Visualized.

Embryo:  Not Visualized.

Cardiac Activity: Not Visualized.

Maternal uterus/adnexae: The right ovary is unremarkable. The left
ovary is not visualized. The uterus is otherwise unremarkable.
IMPRESSION: Findings meet definitive criteria for failed pregnancy. This follows
SRU consensus guidelines: Diagnostic Criteria for Nonviable
Pregnancy Early in the First Trimester. N Engl J Med

## 2022-10-16 ENCOUNTER — Other Ambulatory Visit: Payer: Self-pay

## 2022-10-16 ENCOUNTER — Emergency Department (HOSPITAL_BASED_OUTPATIENT_CLINIC_OR_DEPARTMENT_OTHER)
Admission: EM | Admit: 2022-10-16 | Discharge: 2022-10-16 | Disposition: A | Discharge status: Home / Self Care | Payer: Self-pay | Attending: Emergency Medicine | Admitting: Emergency Medicine

## 2022-10-16 ENCOUNTER — Emergency Department (HOSPITAL_BASED_OUTPATIENT_CLINIC_OR_DEPARTMENT_OTHER): Payer: Self-pay

## 2022-10-16 ENCOUNTER — Other Ambulatory Visit (HOSPITAL_BASED_OUTPATIENT_CLINIC_OR_DEPARTMENT_OTHER): Payer: Self-pay

## 2022-10-16 ENCOUNTER — Encounter (HOSPITAL_BASED_OUTPATIENT_CLINIC_OR_DEPARTMENT_OTHER): Payer: Self-pay | Admitting: Emergency Medicine

## 2022-10-16 DIAGNOSIS — R102 Pelvic and perineal pain: Secondary | ICD-10-CM | POA: Insufficient documentation

## 2022-10-16 LAB — COMPREHENSIVE METABOLIC PANEL
ALT: 12 U/L (ref 0–44)
AST: 14 U/L — ABNORMAL LOW (ref 15–41)
Albumin: 4.5 g/dL (ref 3.5–5.0)
Alkaline Phosphatase: 44 U/L (ref 38–126)
Anion gap: 8 (ref 5–15)
BUN: 10 mg/dL (ref 6–20)
CO2: 26 mmol/L (ref 22–32)
Calcium: 9.3 mg/dL (ref 8.9–10.3)
Chloride: 104 mmol/L (ref 98–111)
Creatinine, Ser: 0.64 mg/dL (ref 0.44–1.00)
GFR, Estimated: >60 mL/min (ref >60)
Glucose, Bld: 108 mg/dL — ABNORMAL HIGH (ref 70–99)
Potassium: 3.7 mmol/L (ref 3.5–5.1)
Sodium: 138 mmol/L (ref 135–145)
Total Bilirubin: 0.2 mg/dL — ABNORMAL LOW (ref 0.3–1.2)
Total Protein: 7.7 g/dL (ref 6.5–8.1)

## 2022-10-16 LAB — CBC
HCT: 35.8 % — ABNORMAL LOW (ref 36.0–46.0)
Hemoglobin: 12 g/dL (ref 12.0–15.0)
MCH: 27.6 pg (ref 26.0–34.0)
MCHC: 33.5 g/dL (ref 30.0–36.0)
MCV: 82.5 fL (ref 80.0–100.0)
Platelets: 201 10*3/uL (ref 150–400)
RBC: 4.34 MIL/uL (ref 3.87–5.11)
RDW: 13.2 % (ref 11.5–15.5)
WBC: 4.9 10*3/uL (ref 4.0–10.5)
nRBC: 0 % (ref 0.0–0.2)

## 2022-10-16 LAB — URINALYSIS, ROUTINE W REFLEX MICROSCOPIC
Bacteria, UA: NONE SEEN
Bilirubin Urine: NEGATIVE
Glucose, UA: NEGATIVE mg/dL
Ketones, ur: NEGATIVE mg/dL
Leukocytes,Ua: NEGATIVE
Nitrite: NEGATIVE
Protein, ur: NEGATIVE mg/dL
RBC / HPF: >50 RBC/hpf (ref 0–5)
Specific Gravity, Urine: <1.005 — ABNORMAL LOW (ref 1.005–1.030)
pH: 6.5 (ref 5.0–8.0)

## 2022-10-16 LAB — PREGNANCY, URINE: Preg Test, Ur: NEGATIVE

## 2022-10-16 MED ORDER — IBUPROFEN 600 MG PO TABS: 600.0000 mg | ORAL_TABLET | Freq: Four times a day (QID) | ORAL | 0 refills | Status: DC | PRN

## 2022-10-16 MED ORDER — IBUPROFEN 400 MG PO TABS
600.0000 mg | ORAL_TABLET | Freq: Once | ORAL | Status: AC
  Administered 2022-10-16: 600 mg via ORAL
  Filled 2022-10-16: qty 1

## 2022-10-16 NOTE — ED Triage Notes (Addendum)
Pt presents to ED POV. Pt c/o pelvic pina x2w. Pt reports that ~1w before her menstrual cycle every month she has severe pelvic pain that lasts throughout her cycle. Pt reports that she had miscarriage ~1y ago and the pain has been worse since. LMP - 10/11/2022  Also c/o HA

## 2022-10-16 NOTE — ED Provider Notes (Signed)
[place] EMERGENCY DEPARTMENT AT [facility] Provider Note   CSN: [id number] Arrival date & time: 10/16/22  4098     History  Chief Complaint  Patient presents with   Pelvic Pain    April Boone is a 37 y.o. female.  HPI   37 year old female presents emergency department with complaints of pelvic pain.  Patient states that she has been having worsening intermittent pelvic pain since her miscarriage about a year ago.  States that she gets pelvic pain approximately 1 week prior to cycle which persist throughout menstrual cycle.  States that she is currently menstruating and cycle began this past Sunday with pain beginning approximately week prior.  States she was already bringing her daughter to the emergency department and decided to check herself and as well.  Has been trying some topical creams to her lower abdomen but is taken no medication for her symptoms.  Denies any worsening bleeding from what is normal for her.  Denies any fever, chills, nausea, vomiting, back pain, urinary symptoms, change in bowel habits.  States the pain feels similar to other times during her menstrual cycle.  Past medical history significant for endometrial polyp  Home Medications Prior to Admission medications   Medication Sig Start Date End Date Taking? Authorizing Provider  ibuprofen (ADVIL) 600 MG tablet Take 1 tablet (600 mg total) by mouth every 6 (six) hours as needed. 10/16/22  Yes Sherian Maroon A, PA  acetaminophen (TYLENOL) 500 MG tablet Take 1-2 tablets (500-1,000 mg total) by mouth every 6 (six) hours as needed for headache. 07/30/20   Venora Maples, MD  naproxen (NAPROSYN) 500 MG tablet Take 1 tablet (500 mg total) by mouth 2 (two) times daily with a meal. 01/22/21   Anyanwu, Jethro Bastos, MD  Prenatal Vit-Fe Fumarate-FA (PRENATAL VITAMINS) 28-0.8 MG TABS Take 1 tablet by mouth daily. 07/01/20   Rasch, Harolyn Rutherford, NP      Allergies    Patient has no known allergies.     Review of Systems   Review of Systems  All other systems reviewed and are negative.   Physical Exam Updated Vital Signs BP 116/82   Pulse 68   Temp 98.5 F (36.9 C) (Oral)   Resp 16   SpO2 100%  Physical Exam Vitals and nursing note reviewed.  Constitutional:      General: She is not in acute distress.    Appearance: She is well-developed.  HENT:     Head: Normocephalic and atraumatic.  Eyes:     Conjunctiva/sclera: Conjunctivae normal.  Cardiovascular:     Rate and Rhythm: Normal rate and regular rhythm.     Heart sounds: No murmur heard. Pulmonary:     Effort: Pulmonary effort is normal. No respiratory distress.     Breath sounds: Normal breath sounds.  Abdominal:     Palpations: Abdomen is soft.     Tenderness: There is abdominal tenderness. There is no right CVA tenderness or left CVA tenderness.     Comments: Lower abdominal/pelvic tenderness.  Musculoskeletal:        General: No swelling.     Cervical back: Neck supple.  Skin:    General: Skin is warm and dry.     Capillary Refill: Capillary refill takes less than 2 seconds.  Neurological:     Mental Status: She is alert.  Psychiatric:        Mood and Affect: Mood normal.     ED Results / Procedures / Treatments  Labs (all labs ordered are listed, but only abnormal results are displayed) Labs Reviewed  COMPREHENSIVE METABOLIC PANEL - Abnormal; Notable for the following components:      Result Value   Glucose, Bld 108 (*)    AST 14 (*)    Total Bilirubin 0.2 (*)    All other components within normal limits  CBC - Abnormal; Notable for the following components:   HCT 35.8 (*)    All other components within normal limits  URINALYSIS, ROUTINE W REFLEX MICROSCOPIC - Abnormal; Notable for the following components:   Color, Urine COLORLESS (*)    Specific Gravity, Urine <1.005 (*)    Hgb urine dipstick LARGE (*)    All other components within normal limits  PREGNANCY, URINE     EKG None  Radiology US PELVIC COMPLETE W TRANSVAGINAL AND TORSION R/O  Result Date: 10/16/2022 CLINICAL DATA:  Chronic severe pelvic pain. EXAM: TRANSABDOMINAL AND TRANSVAGINAL ULTRASOUND OF PELVIS DOPPLER ULTRASOUND OF OVARIES TECHNIQUE: Both transabdominal and transvaginal ultrasound examinations of the pelvis were performed. Transabdominal technique was performed for global imaging of the pelvis including uterus, ovaries, adnexal regions, and pelvic cul-de-sac. It was necessary to proceed with endovaginal exam following the transabdominal exam to visualize the endometrium and ovaries. Color and duplex Doppler ultrasound was utilized to evaluate blood flow to the ovaries. COMPARISON:  Pelvic ultrasound dated November 02, 2020. FINDINGS: Uterus Measurements: 9.7 x 4.4 x 6.1 cm = volume: 138 mL. No fibroids or other mass visualized. Endometrium Thickness: 3.  No focal abnormality visualized. Right ovary Measurements: 2.6 x 1.7 x 1.6 cm = volume: 3.8 mL. Normal appearance/no adnexal mass. Left ovary Measurements: 3.6 x 2.2 x 1.6 cm = volume: 6.9 mL. Normal appearance/no adnexal mass. Pulsed Doppler evaluation of both ovaries demonstrates normal low-resistance arterial and venous waveforms. Other findings No abnormal free fluid. IMPRESSION: 1. Normal pelvic ultrasound. Electronically Signed   By: Obie Dredge M.D.   On: 10/16/2022 12:41    Procedures Procedures    Medications Ordered in ED Medications  ibuprofen (ADVIL) tablet 600 mg (600 mg Oral Given 10/16/22 1036)    ED Course/ Medical Decision Making/ A&P                                 Medical Decision Making Amount and/or Complexity of Data Reviewed Labs: ordered. Radiology: ordered.  Risk Prescription drug management.   This patient presents to the ED for concern of pelvic pain, this involves an extensive number of treatment options, and is a complaint that carries with it a high risk of complications and morbidity.  The  differential diagnosis includes malignancy, tubo-ovarian abscess, ectopic pregnancy, ovarian torsion, diverticulitis, appendicitis, cystitis, pyelonephritis, SBO/LBO   Co morbidities that complicate the patient evaluation  See HPI   Additional history obtained:  Additional history obtained from EMR External records from outside source obtained and reviewed including hospital records   Lab Tests:  I Ordered, and personally interpreted labs.  The pertinent results include: No leukocytosis.  No evidence of anemia.  Platelets within range.  No electrolyte abnormalities.  No transaminitis.  No renal dysfunction.  UA with large hemoglobin but otherwise unremarkable.  Urine pregnancy negative.   Imaging Studies ordered:  I ordered imaging studies including pelvic ultrasound I independently visualized and interpreted imaging which showed no acute abnormality I agree with the radiologist interpretation   Cardiac Monitoring: / EKG:  The patient was maintained  on a cardiac monitor.  I personally viewed and interpreted the cardiac monitored which showed an underlying rhythm of: Sinus rhythm   Consultations Obtained:  N/a   Problem List / ED Course / Critical interventions / Medication management  Pelvic pain I ordered medication including Motrin   Reevaluation of the patient after these medicines showed that the patient improved I have reviewed the patients home medicines and have made adjustments as needed   Social Determinants of Health:  Denies tobacco, illicit drug use   Test / Admission - Considered:  Pelvic pain Vitals signs within normal range and stable throughout visit. Laboratory/imaging studies significant for: See above 37 year old female presents emergency department with complaints of pelvic pain.  Patient seems to be having increased pelvic pain since prior miscarriage about a year ago.  Pain seems to be isolated to her experiencing her menstrual cycles.   Given continued/worsening pain, laboratory and imaging studies were obtained.  Laboratory studies grossly unremarkable for any acute emergent process.  Pelvic ultrasound was negative for any acute abnormality.  I suspect patient's pelvic pain/vaginal bleeding most likely secondary to normal menstruation and seem to be more chronic in nature.  Will recommend NSAIDs for treatment of pain and follow-up with OB/GYN in the outpatient setting.  Treatment plan discussed at length with patient and she acknowledged understanding was agreeable to said plan.  Patient overall well-appearing, afebrile in no acute distress. Worrisome signs and symptoms were discussed with the patient, and the patient acknowledged understanding to return to the ED if noticed. Patient was stable upon discharge.          Final Clinical Impression(s) / ED Diagnoses Final diagnoses:  Pelvic pain    Rx / DC Orders ED Discharge Orders          Ordered    ibuprofen (ADVIL) 600 MG tablet  Every 6 hours PRN        10/16/22 1254              Peter Garter, PA 10/16/22 1315    Virgina Norfolk, DO 10/16/22 1452

## 2022-10-16 NOTE — Discharge Instructions (Addendum)
Blood work appeared normal.  No obvious abnormality on your pelvic ultrasound.  Recommend follow-up with your OB/GYN in the outpatient setting for reevaluation of your symptoms.  Will prescribe medication in the form of higher dose ibuprofen to take as needed for pain/discomfort.  Please do not hesitate to return to the emergency department if there are worrisome signs and symptoms we discussed to become apparent.

## 2022-10-16 NOTE — ED Notes (Signed)
Pt discharged home and given discharge paperwork. Opportunities given for questions. Pt verbalizes understanding. PIV removed x1. Prescriptions reviewed as well.  Jillyn Hidden , RN

## 2022-11-24 ENCOUNTER — Ambulatory Visit (HOSPITAL_COMMUNITY)
Admission: EM | Admit: 2022-11-24 | Discharge: 2022-11-24 | Disposition: A | Discharge status: Home / Self Care | Payer: Self-pay | Attending: Nurse Practitioner | Admitting: Nurse Practitioner

## 2022-11-24 ENCOUNTER — Encounter (HOSPITAL_COMMUNITY): Payer: Self-pay

## 2022-11-24 DIAGNOSIS — R110 Nausea: Secondary | ICD-10-CM

## 2022-11-24 DIAGNOSIS — Z3201 Encounter for pregnancy test, result positive: Secondary | ICD-10-CM

## 2022-11-24 LAB — POCT URINE PREGNANCY: Preg Test, Ur: POSITIVE — AB

## 2022-11-24 LAB — POCT URINALYSIS DIP (MANUAL ENTRY)
Bilirubin, UA: NEGATIVE
Blood, UA: NEGATIVE
Glucose, UA: NEGATIVE mg/dL
Ketones, POC UA: NEGATIVE mg/dL
Leukocytes, UA: NEGATIVE
Nitrite, UA: NEGATIVE
Protein Ur, POC: NEGATIVE mg/dL
Spec Grav, UA: 1.01 (ref 1.010–1.025)
Urobilinogen, UA: 0.2 E.U./dL
pH, UA: 5.5 (ref 5.0–8.0)

## 2022-11-24 MED ORDER — DOXYLAMINE SUCCINATE (SLEEP) 25 MG PO TABS: 12.5000 mg | ORAL_TABLET | Freq: Every evening | ORAL | 0 refills | Status: DC | PRN

## 2022-11-24 MED ORDER — PRENATAL VITAMINS 28-0.8 MG PO TABS: 1.0000 | ORAL_TABLET | Freq: Every day | ORAL | 0 refills | Status: DC

## 2022-11-24 MED ORDER — VITAMIN B-6 25 MG PO TABS: 25.0000 mg | ORAL_TABLET | Freq: Three times a day (TID) | ORAL | 0 refills | Status: DC | PRN

## 2022-11-24 NOTE — Discharge Instructions (Addendum)
The pregnancy test today is positive.  Start taking a Prenatal Vitamin.  Follow up with OB/GYN, contact information for you to call and schedule an appointment is provided today.  Your submitted due date is Jul 18, 2023.  You are 6 weeks, 2 days pregnant today last menstrual cycle.  For the nausea, you can take vitamin B6 up to 3 times daily as needed.  You can also take 1/2 tablet of the Unisom at nighttime as needed to help with nausea.  Other medications to help with nausea that are safe in pregnancy are Gas-X, Maalox/Mylanta, and Tums.

## 2022-11-24 NOTE — ED Provider Notes (Signed)
[facility]    CSN: [id number] Arrival date & time: 11/24/22  0807      History   Chief Complaint Chief Complaint  Patient presents with   Nausea    HPI April Boone is a 37 y.o. female.   Patient presents today with 1 day history of nausea without vomiting and body aches.  Reports symptoms began after eating last night around 12 midnight.  She denies abdominal pain, fever, cough, congestion and sore throat.  No changes in appetite or changes in bowel movements.  Reports LMP 10/11/22, menstrual cycle has been irregular lately since miscarriage.     Past Medical History:  Diagnosis Date   Obese     There are no problems to display for this patient.   History reviewed. No pertinent surgical history.  OB History     Gravida  4   Para  3   Term  3   Preterm      AB  1   Living  4      SAB  1   IAB      Ectopic      Multiple  1   Live Births  4            Home Medications    Prior to Admission medications   Medication Sig Start Date End Date Taking? Authorizing Provider  doxylamine, Sleep, (UNISOM) 25 MG tablet Take 0.5 tablets (12.5 mg total) by mouth at bedtime as needed (nausea/vomiting). 11/24/22  Yes Valentino Nose, NP  pyridOXINE (VITAMIN B6) 25 MG tablet Take 1 tablet (25 mg total) by mouth 3 (three) times daily as needed. 11/24/22  Yes Valentino Nose, NP  acetaminophen (TYLENOL) 500 MG tablet Take 1-2 tablets (500-1,000 mg total) by mouth every 6 (six) hours as needed for headache. 07/30/20   Venora Maples, MD  Prenatal Vit-Fe Fumarate-FA (PRENATAL VITAMINS) 28-0.8 MG TABS Take 1 tablet by mouth daily. 11/24/22   Valentino Nose, NP    Family History Family History  Problem Relation Age of Onset   Healthy Mother    Healthy Father     Social History Social History   Tobacco Use   Smoking status: Never   Smokeless tobacco: Never  Vaping Use   Vaping status: Never Used  Substance Use Topics    Alcohol use: Not Currently   Drug use: Not Currently     Allergies   Patient has no known allergies.   Review of Systems Review of Systems Per HPI  Physical Exam Triage Vital Signs ED Triage Vitals [11/24/22 0838]  Encounter Vitals Group     BP 118/77     Systolic BP Percentile      Diastolic BP Percentile      Pulse Rate 97     Resp 16     Temp 98.5 F (36.9 C)     Temp Source Oral     SpO2 99 %     Weight      Height      Head Circumference      Peak Flow      Pain Score 0     Pain Loc      Pain Education      Exclude from Growth Chart    No data found.  Updated Vital Signs BP 118/77 (BP Location: Left Arm)   Pulse 97   Temp 98.5 F (36.9 C) (Oral)   Resp 16   LMP 10/11/2022 (Exact  Date)   SpO2 99%   Visual Acuity Right Eye Distance:   Left Eye Distance:   Bilateral Distance:    Right Eye Near:   Left Eye Near:    Bilateral Near:     Physical Exam Vitals and nursing note reviewed.  Constitutional:      General: She is not in acute distress.    Appearance: Normal appearance. She is not toxic-appearing.  HENT:     Head: Normocephalic and atraumatic.     Mouth/Throat:     Mouth: Mucous membranes are moist.     Pharynx: Oropharynx is clear.  Eyes:     General: No scleral icterus.    Extraocular Movements: Extraocular movements intact.  Cardiovascular:     Rate and Rhythm: Normal rate and regular rhythm.  Pulmonary:     Effort: Pulmonary effort is normal. No respiratory distress.     Breath sounds: Normal breath sounds. No wheezing, rhonchi or rales.  Abdominal:     General: Abdomen is flat. Bowel sounds are normal. There is no distension.     Palpations: Abdomen is soft.     Tenderness: There is no abdominal tenderness. There is no guarding.  Musculoskeletal:     Cervical back: Normal range of motion.  Lymphadenopathy:     Cervical: No cervical adenopathy.  Skin:    General: Skin is warm and dry.     Capillary Refill: Capillary refill  takes less than 2 seconds.     Coloration: Skin is not jaundiced or pale.     Findings: No erythema.  Neurological:     Mental Status: She is alert and oriented to person, place, and time.  Psychiatric:        Behavior: Behavior is cooperative.      UC Treatments / Results  Labs (all labs ordered are listed, but only abnormal results are displayed) Labs Reviewed  POCT URINE PREGNANCY - Abnormal; Notable for the following components:      Result Value   Preg Test, Ur Positive (*)    All other components within normal limits  POCT URINALYSIS DIP (MANUAL ENTRY)    EKG   Radiology No results found.  Procedures Procedures (including critical care time)  Medications Ordered in UC Medications - No data to display  Initial Impression / Assessment and Plan / UC Course  I have reviewed the triage vital signs and the nursing notes.  Pertinent labs & imaging results that were available during my care of the patient were reviewed by me and considered in my medical decision making (see chart for details).   Patient is well-appearing, normotensive, afebrile, not tachycardic, not tachypneic, oxygenating well on room air.    1. Nausea without vomiting 2. Positive pregnancy test Suspect symptoms related to pregnancy as pregnancy test today is positive Based on last menstrual cycle, estimated due date May 2025 Start prenatal vitamin, prescription sent to pharmacy along with Diclegis to help with nausea/vomiting Discussed other over-the-counter supportive medications that she can take for nausea/heartburn/indigestion Recommended establishing care with OB/GYN and contact information given today  The patient was given the opportunity to ask questions.  All questions answered to their satisfaction.  The patient is in agreement to this plan.    Final Clinical Impressions(s) / UC Diagnoses   Final diagnoses:  Nausea without vomiting  Positive pregnancy test     Discharge Instructions       The pregnancy test today is positive.  Start taking a Prenatal Vitamin.  Follow  up with OB/GYN, contact information for you to call and schedule an appointment is provided today.  Your submitted due date is Jul 18, 2023.  You are 6 weeks, 2 days pregnant today last menstrual cycle.  For the nausea, you can take vitamin B6 up to 3 times daily as needed.  You can also take 1/2 tablet of the Unisom at nighttime as needed to help with nausea.  Other medications to help with nausea that are safe in pregnancy are Gas-X, Maalox/Mylanta, and Tums.     ED Prescriptions     Medication Sig Dispense Auth. Provider   Prenatal Vit-Fe Fumarate-FA (PRENATAL VITAMINS) 28-0.8 MG TABS Take 1 tablet by mouth daily. 30 tablet Cathlean Marseilles A, NP   pyridOXINE (VITAMIN B6) 25 MG tablet Take 1 tablet (25 mg total) by mouth 3 (three) times daily as needed. 30 tablet Cathlean Marseilles A, NP   doxylamine, Sleep, (UNISOM) 25 MG tablet Take 0.5 tablets (12.5 mg total) by mouth at bedtime as needed (nausea/vomiting). 30 tablet Valentino Nose, NP      PDMP not reviewed this encounter.   Valentino Nose, NP 11/24/22 1053

## 2022-11-24 NOTE — ED Triage Notes (Signed)
Patient here today with c/o nausea and body aches since yesterday. Patient states that she has some abd pain off and on. LMP 10/11/2022.

## 2022-11-28 ENCOUNTER — Inpatient Hospital Stay (HOSPITAL_COMMUNITY)
Admission: AD | Admit: 2022-11-28 | Discharge: 2022-11-28 | Disposition: A | Discharge status: Home / Self Care | Payer: Medicaid Other | Attending: Obstetrics and Gynecology | Admitting: Obstetrics and Gynecology

## 2022-11-28 ENCOUNTER — Encounter (HOSPITAL_COMMUNITY): Payer: Self-pay | Admitting: Obstetrics and Gynecology

## 2022-11-28 ENCOUNTER — Inpatient Hospital Stay (HOSPITAL_COMMUNITY): Payer: Medicaid Other

## 2022-11-28 ENCOUNTER — Other Ambulatory Visit: Payer: Self-pay

## 2022-11-28 DIAGNOSIS — O208 Other hemorrhage in early pregnancy: Secondary | ICD-10-CM | POA: Diagnosis not present

## 2022-11-28 DIAGNOSIS — R1032 Left lower quadrant pain: Secondary | ICD-10-CM | POA: Insufficient documentation

## 2022-11-28 DIAGNOSIS — O26891 Other specified pregnancy related conditions, first trimester: Secondary | ICD-10-CM | POA: Insufficient documentation

## 2022-11-28 DIAGNOSIS — M5459 Other low back pain: Secondary | ICD-10-CM | POA: Diagnosis not present

## 2022-11-28 DIAGNOSIS — Z3A01 Less than 8 weeks gestation of pregnancy: Secondary | ICD-10-CM | POA: Insufficient documentation

## 2022-11-28 DIAGNOSIS — O26899 Other specified pregnancy related conditions, unspecified trimester: Secondary | ICD-10-CM

## 2022-11-28 LAB — CBC
HCT: 35.6 % — ABNORMAL LOW (ref 36.0–46.0)
Hemoglobin: 11.9 g/dL — ABNORMAL LOW (ref 12.0–15.0)
MCH: 28.1 pg (ref 26.0–34.0)
MCHC: 33.4 g/dL (ref 30.0–36.0)
MCV: 84 fL (ref 80.0–100.0)
Platelets: 202 10*3/uL (ref 150–400)
RBC: 4.24 MIL/uL (ref 3.87–5.11)
RDW: 13.4 % (ref 11.5–15.5)
WBC: 6.7 10*3/uL (ref 4.0–10.5)
nRBC: 0 % (ref 0.0–0.2)

## 2022-11-28 LAB — ABO/RH: ABO/RH(D): B POS

## 2022-11-28 LAB — URINALYSIS, ROUTINE W REFLEX MICROSCOPIC
Bilirubin Urine: NEGATIVE
Glucose, UA: NEGATIVE mg/dL
Hgb urine dipstick: NEGATIVE
Ketones, ur: NEGATIVE mg/dL
Leukocytes,Ua: NEGATIVE
Nitrite: NEGATIVE
Protein, ur: NEGATIVE mg/dL
Specific Gravity, Urine: 1.004 — ABNORMAL LOW (ref 1.005–1.030)
pH: 6 (ref 5.0–8.0)

## 2022-11-28 LAB — WET PREP, GENITAL
Sperm: NONE SEEN
Trich, Wet Prep: NONE SEEN
WBC, Wet Prep HPF POC: <10 (ref <10)
Yeast Wet Prep HPF POC: NONE SEEN

## 2022-11-28 LAB — HCG, QUANTITATIVE, PREGNANCY: hCG, Beta Chain, Quant, S: 23502 m[IU]/mL — ABNORMAL HIGH (ref <5)

## 2022-11-28 MED ORDER — METRONIDAZOLE 500 MG PO TABS: 500.0000 mg | ORAL_TABLET | Freq: Two times a day (BID) | ORAL | 0 refills | Status: DC

## 2022-11-28 NOTE — MAU Note (Signed)
April Boone is a 37 y.o. at Unknown here in MAU reporting: lower mid and left lower abdominal pain that started 2 days ago that comes and goes. Reports lower to mid and left lower back pain. Denies any vaginal bleeding.  Denies any vaginal pain, itching or odor. Patient was seen in ER on 11/24/22 and had a + UPT.   LMP: 10/11/22 Onset of complaint: 2 days ago  Pain score: 8 abdomen 6 back Vitals:   11/28/22 0826  BP: 118/69  Pulse: 77  Resp: 17  Temp: 97.9 F (36.6 C)  SpO2: 100%     FHT: n/a  Lab orders placed from triage:  UA

## 2022-11-28 NOTE — MAU Provider Note (Signed)
Chief Complaint:  Abdominal Pain and Back Pain  HPI   Event Date/Time   First Provider Initiated Contact with Patient 11/28/22 1059     April Boone is a 37 y.o. [id number] at [redacted]w[redacted]d who presents to maternity admissions reporting lower left abdominal cramping that wraps around to her back and is worst at night. Denies any vaginal discharge or bleeding, no other physical complaints.   Pregnancy Course: Will receive care at [facility], has new OB intake visit scheduled for 12/28/22.  Past Medical History:  Diagnosis Date   Obese    OB History  Gravida Para Term Preterm AB Living  5 3 3   1 4   SAB IAB Ectopic Multiple Live Births  1     1 4     # Outcome Date GA Lbr Len/2nd Weight Sex Type Anes PTL Lv  5 Current           4 SAB 06/2020          3 Term 04/2012     Vag-Spont   LIV  2 Term 03/02/09 [redacted]w[redacted]d   M Vag-Spont None  LIV  1A Term 02/14/07     Vag-Spont   LIV  1B Term 02/14/07     Vag-Spont  N LIV   No past surgical history on file. Family History  Problem Relation Age of Onset   Healthy Mother    Healthy Father    Social History   Tobacco Use   Smoking status: Never   Smokeless tobacco: Never  Vaping Use   Vaping status: Never Used  Substance Use Topics   Alcohol use: Not Currently   Drug use: Not Currently   No Known Allergies Medications Prior to Admission  Medication Sig Dispense Refill Last Dose   acetaminophen (TYLENOL) 500 MG tablet Take 1-2 tablets (500-1,000 mg total) by mouth every 6 (six) hours as needed for headache. 100 tablet 0    doxylamine, Sleep, (UNISOM) 25 MG tablet Take 0.5 tablets (12.5 mg total) by mouth at bedtime as needed (nausea/vomiting). 30 tablet 0    Prenatal Vit-Fe Fumarate-FA (PRENATAL VITAMINS) 28-0.8 MG TABS Take 1 tablet by mouth daily. 30 tablet 0    pyridOXINE (VITAMIN B6) 25 MG tablet Take 1 tablet (25 mg total) by mouth 3 (three) times daily as needed. 30 tablet 0    I have reviewed patient's Past Medical Hx, Surgical Hx, Family  Hx, Social Hx, medications and allergies.   ROS  Pertinent items noted in HPI and remainder of comprehensive ROS otherwise negative.   PHYSICAL EXAM  Patient Vitals for the past 24 hrs:  BP Temp Temp src Pulse Resp SpO2 Weight  11/28/22 0826 118/69 97.9 F (36.6 C) Oral 77 17 100 % 195 lb 14.4 oz (88.9 kg)   Constitutional: Well-developed, well-nourished female in no acute distress.  Cardiovascular: normal rate & rhythm, warm and well-perfused Respiratory: normal effort, no problems with respiration noted GI: Abd soft, non-tender, non-distended MS: Extremities nontender, no edema, normal ROM Neurologic: Alert and oriented x 4.  GU: no CVA tenderness Pelvic: exam deferred, self-swabs collected and sent to U/S   Labs: Results for orders placed or performed during the hospital encounter of 11/28/22 (from the past 24 hour(s))  Wet prep, genital     Status: Abnormal   Collection Time: 11/28/22  8:34 AM   Specimen: PATH Cytology Cervicovaginal Ancillary Only  Result Value Ref Range   Yeast Wet Prep HPF POC NONE SEEN NONE SEEN   Trich, Wet  Prep NONE SEEN NONE SEEN   Clue Cells Wet Prep HPF POC PRESENT (A) NONE SEEN   WBC, Wet Prep HPF POC <10 <10   Sperm NONE SEEN   CBC     Status: Abnormal   Collection Time: 11/28/22  8:45 AM  Result Value Ref Range   WBC 6.7 4.0 - 10.5 K/uL   RBC 4.24 3.87 - 5.11 MIL/uL   Hemoglobin 11.9 (L) 12.0 - 15.0 g/dL   HCT 41.6 (L) 60.6 - 30.1 %   MCV 84.0 80.0 - 100.0 fL   MCH 28.1 26.0 - 34.0 pg   MCHC 33.4 30.0 - 36.0 g/dL   RDW 60.1 09.3 - 23.5 %   Platelets 202 150 - 400 K/uL   nRBC 0.0 0.0 - 0.2 %  hCG, quantitative, pregnancy     Status: Abnormal   Collection Time: 11/28/22  8:45 AM  Result Value Ref Range   hCG, Beta Chain, Quant, S 23,502 (H) <5 mIU/mL  Urinalysis, Routine w reflex microscopic -Urine, Clean Catch     Status: Abnormal   Collection Time: 11/28/22  8:47 AM  Result Value Ref Range   Color, Urine STRAW (A) YELLOW    APPearance CLEAR CLEAR   Specific Gravity, Urine 1.004 (L) 1.005 - 1.030   pH 6.0 5.0 - 8.0   Glucose, UA NEGATIVE NEGATIVE mg/dL   Hgb urine dipstick NEGATIVE NEGATIVE   Bilirubin Urine NEGATIVE NEGATIVE   Ketones, ur NEGATIVE NEGATIVE mg/dL   Protein, ur NEGATIVE NEGATIVE mg/dL   Nitrite NEGATIVE NEGATIVE   Leukocytes,Ua NEGATIVE NEGATIVE  ABO/Rh     Status: None   Collection Time: 11/28/22  8:47 AM  Result Value Ref Range   ABO/RH(D) B POS    No rh immune globuloin      NOT A RH IMMUNE GLOBULIN CANDIDATE, PT RH POSITIVE Performed at [facility], [address], [place], [place] [address]    Imaging:  US OB LESS THAN 14 WEEKS WITH [place] TRANSVAGINAL  Result Date: 11/28/2022 CLINICAL DATA:  Abdominal pain. Gestational age by last menstrual period is 6 weeks and 6 days. EXAM: OBSTETRIC <14 WK [place] AND TRANSVAGINAL OB US TECHNIQUE: Both transabdominal and transvaginal ultrasound examinations were performed for complete evaluation of the gestation as well as the maternal uterus, adnexal regions, and pelvic cul-de-sac. Transvaginal technique was performed to assess early pregnancy. COMPARISON:  Pelvic ultrasound dated 10/16/2022. FINDINGS: Intrauterine gestational sac: Single Yolk sac:  Visualized. Embryo:  Not Visualized. Cardiac Activity: Not Visualized. MSD: 13 mm   6 w   1  d Subchorionic hemorrhage:  Moderate. Maternal uterus/adnexae: The right ovary is not seen. The left ovary appears normal. No free intraperitoneal fluid. IMPRESSION: Single intrauterine gestational sac and yolk sac. Moderate subchorionic hemorrhage. Electronically Signed   By: Romona Curls M.D.   On: 11/28/2022 10:00    MDM & MAU COURSE  MDM: Moderate  MAU Course: Orders Placed This Encounter  Procedures   Wet prep, genital   US OB LESS THAN 14 WEEKS WITH OB TRANSVAGINAL   US OB Transvaginal   Urinalysis, Routine w reflex microscopic -Urine, Clean Catch   CBC   hCG, quantitative, pregnancy   ABO/Rh    Discharge patient   Meds ordered this encounter  Medications   metroNIDAZOLE (FLAGYL) 500 MG tablet    Sig: Take 1 tablet (500 mg total) by mouth 2 (two) times daily.    Dispense:  14 tablet    Refill:  0  Pt clinically stable so medically screened and sent for labs/ultrasound.  Results shared with patient re: BV and [facility], bleeding precautions given. Discussed how [facility] can be a threat to a pregnancy depending on size. Will need a viability scan in 10-14 days. Scheduled at [facility].  ASSESSMENT   1. Abdominal cramping affecting pregnancy   2. Subchorionic hemorrhage of placenta in first trimester   3. [redacted] weeks gestation of pregnancy    PLAN  Discharge home in stable condition with bleeding precautions.     Follow-up Information     Center for [facility] Follow up.   Specialty: Obstetrics and Gynecology Why: as scheduled for ongoing prenatal care Contact information: [address] [place] [address] [phone]                Future Appointments  Date Time Provider Department Center  12/10/2022  9:15 AM [facility] US2 [facility]  12/28/2022  9:15 AM [facility] INTAKE [facility] None   Allergies as of 11/28/2022   No Known Allergies      Medication List     TAKE these medications    acetaminophen 500 MG tablet Commonly known as: TYLENOL Take 1-2 tablets (500-1,000 mg total) by mouth every 6 (six) hours as needed for headache.   doxylamine (Sleep) 25 MG tablet Commonly known as: UNISOM Take 0.5 tablets (12.5 mg total) by mouth at bedtime as needed (nausea/vomiting).   metroNIDAZOLE 500 MG tablet Commonly known as: FLAGYL Take 1 tablet (500 mg total) by mouth 2 (two) times daily.   Prenatal Vitamins 28-0.8 MG Tabs Take 1 tablet by mouth daily.   pyridOXINE 25 MG tablet Commonly known as: VITAMIN B6 Take 1 tablet (25 mg total) by mouth 3 (three) times daily as needed.       Edd Arbour,  CNM, MSN, IBCLC Certified Nurse Midwife, [facility]

## 2022-11-30 LAB — GC/CHLAMYDIA PROBE AMP (~~LOC~~) NOT AT ARMC
Chlamydia: NEGATIVE
Comment: NEGATIVE
Comment: NORMAL
Neisseria Gonorrhea: NEGATIVE

## 2022-12-10 ENCOUNTER — Ambulatory Visit: Payer: Medicaid Other

## 2022-12-10 ENCOUNTER — Other Ambulatory Visit: Payer: Self-pay

## 2022-12-10 DIAGNOSIS — Z3A01 Less than 8 weeks gestation of pregnancy: Secondary | ICD-10-CM | POA: Diagnosis not present

## 2022-12-10 DIAGNOSIS — O208 Other hemorrhage in early pregnancy: Secondary | ICD-10-CM

## 2022-12-10 DIAGNOSIS — Z3687 Encounter for antenatal screening for uncertain dates: Secondary | ICD-10-CM | POA: Diagnosis not present

## 2022-12-13 ENCOUNTER — Inpatient Hospital Stay (HOSPITAL_COMMUNITY)
Admission: AD | Admit: 2022-12-13 | Discharge: 2022-12-13 | Disposition: A | Discharge status: Home / Self Care | Payer: Medicaid Other | Attending: Obstetrics and Gynecology | Admitting: Obstetrics and Gynecology

## 2022-12-13 ENCOUNTER — Other Ambulatory Visit: Payer: Self-pay

## 2022-12-13 DIAGNOSIS — O26891 Other specified pregnancy related conditions, first trimester: Secondary | ICD-10-CM

## 2022-12-13 DIAGNOSIS — O99611 Diseases of the digestive system complicating pregnancy, first trimester: Secondary | ICD-10-CM | POA: Insufficient documentation

## 2022-12-13 DIAGNOSIS — O219 Vomiting of pregnancy, unspecified: Secondary | ICD-10-CM

## 2022-12-13 DIAGNOSIS — K117 Disturbances of salivary secretion: Secondary | ICD-10-CM

## 2022-12-13 DIAGNOSIS — Z3A09 9 weeks gestation of pregnancy: Secondary | ICD-10-CM

## 2022-12-13 DIAGNOSIS — O99211 Obesity complicating pregnancy, first trimester: Secondary | ICD-10-CM | POA: Insufficient documentation

## 2022-12-13 LAB — URINALYSIS, ROUTINE W REFLEX MICROSCOPIC
Bilirubin Urine: NEGATIVE
Glucose, UA: NEGATIVE mg/dL
Hgb urine dipstick: NEGATIVE
Ketones, ur: NEGATIVE mg/dL
Leukocytes,Ua: NEGATIVE
Nitrite: NEGATIVE
Protein, ur: NEGATIVE mg/dL
Specific Gravity, Urine: 1.018 (ref 1.005–1.030)
pH: 6 (ref 5.0–8.0)

## 2022-12-13 MED ORDER — SCOPOLAMINE 1 MG/3DAYS TD PT72: 1.0000 | MEDICATED_PATCH | TRANSDERMAL | 0 refills | Status: DC

## 2022-12-13 MED ORDER — FAMOTIDINE 20 MG PO TABS
20.0000 mg | ORAL_TABLET | ORAL | Status: AC
  Administered 2022-12-13: 20 mg via ORAL
  Filled 2022-12-13: qty 1

## 2022-12-13 MED ORDER — PROCHLORPERAZINE MALEATE 10 MG PO TABS
10.0000 mg | ORAL_TABLET | Freq: Once | ORAL | Status: AC
  Administered 2022-12-13: 10 mg via ORAL
  Filled 2022-12-13: qty 1

## 2022-12-13 MED ORDER — SCOPOLAMINE 1 MG/3DAYS TD PT72
1.0000 | MEDICATED_PATCH | TRANSDERMAL | Status: DC
  Administered 2022-12-13: 1.5 mg via TRANSDERMAL
  Filled 2022-12-13: qty 1

## 2022-12-13 MED ORDER — ONDANSETRON HCL 4 MG PO TABS: 4.0000 mg | ORAL_TABLET | Freq: Three times a day (TID) | ORAL | 0 refills | Status: DC | PRN

## 2022-12-13 MED ORDER — ONDANSETRON 4 MG PO TBDP
8.0000 mg | ORAL_TABLET | Freq: Once | ORAL | Status: AC
  Administered 2022-12-13: 8 mg via ORAL
  Filled 2022-12-13: qty 2

## 2022-12-13 MED ORDER — FAMOTIDINE 20 MG PO TABS: 20.0000 mg | ORAL_TABLET | Freq: Two times a day (BID) | ORAL | 0 refills | Status: DC

## 2022-12-13 NOTE — MAU Note (Signed)
.  April Boone is a 37 y.o. at [redacted]w[redacted]d here in MAU reporting: constant nausea for several days.  States she feels worse after eating, but also feels bad if she doesn't eat.  Pt states she's not actually throwing up, just spitting.  Complains of indigestion, but reports she doesn't have meds for it. Denies abd pain or vag bleeding  Vitals:   12/13/22 1443  SpO2: 100%   Lab orders placed from triage:   ua

## 2022-12-13 NOTE — MAU Provider Note (Signed)
History     CSN: [id number]  Arrival date and time: 12/13/22 1343   None     Chief Complaint  Patient presents with   Emesis   Nausea   HPI April Boone is a 37 y.o. [id number] at [redacted]w[redacted]d who presents today with complaint of nausea, vomiting, reflux, and excess saliva production. She reports issues have been ongoing since finding out she was pregnant. She struggled with n/v of pregnancy with prior pregnancies. Has been trying to increase intake of food and fluids, but is limited by heartburn and nausea. She feels the need to vomit, but has not actually vomited all day today. She denies any sick contacts or systemic symptoms. She also reports the need to spit more frequently and is spitting frequently into emesis bag.  Past Medical History:  Diagnosis Date   Obese     No past surgical history on file.  Family History  Problem Relation Age of Onset   Healthy Mother    Healthy Father     Social History   Tobacco Use   Smoking status: Never   Smokeless tobacco: Never  Vaping Use   Vaping status: Never Used  Substance Use Topics   Alcohol use: Not Currently   Drug use: Not Currently    Allergies: No Known Allergies  Medications Prior to Admission  Medication Sig Dispense Refill Last Dose   acetaminophen (TYLENOL) 500 MG tablet Take 1-2 tablets (500-1,000 mg total) by mouth every 6 (six) hours as needed for headache. 100 tablet 0    doxylamine, Sleep, (UNISOM) 25 MG tablet Take 0.5 tablets (12.5 mg total) by mouth at bedtime as needed (nausea/vomiting). 30 tablet 0    metroNIDAZOLE (FLAGYL) 500 MG tablet Take 1 tablet (500 mg total) by mouth 2 (two) times daily. 14 tablet 0    Prenatal Vit-Fe Fumarate-FA (PRENATAL VITAMINS) 28-0.8 MG TABS Take 1 tablet by mouth daily. 30 tablet 0    pyridOXINE (VITAMIN B6) 25 MG tablet Take 1 tablet (25 mg total) by mouth 3 (three) times daily as needed. 30 tablet 0    ROS reviewed and pertinent positives and negatives as documented in  HPI.  Physical Exam   Blood pressure 119/69, pulse 96, temperature 99.7 F (37.6 C), temperature source Oral, resp. rate 16, last menstrual period 10/11/2022, SpO2 100%.  Physical Exam Constitutional:      General: She is not in acute distress.    Appearance: Normal appearance. She is not ill-appearing.  HENT:     Head: Normocephalic and atraumatic.  Cardiovascular:     Rate and Rhythm: Normal rate.  Pulmonary:     Effort: Pulmonary effort is normal.     Breath sounds: Normal breath sounds.  Abdominal:     Palpations: Abdomen is soft.     Tenderness: There is no abdominal tenderness. There is no guarding.  Musculoskeletal:        General: Normal range of motion.  Skin:    General: Skin is warm and dry.     Findings: No rash.  Neurological:     General: No focal deficit present.     Mental Status: She is alert and oriented to person, place, and time.     MAU Course  Procedures  MDM 37 y.o. [id number] at [redacted]w[redacted]d who presents today with c/o n/v, reflux, excess saliva production. Her vital signs are stable, exam is unremarkable, and UA is negative. Her reflux resolved w Pepcid, nausea and vomiting did not resolve with Compazine, so tried  Zofran with good resolution of sxs. She was also given a Scopolamine patch for nausea and ptyalism with relief of symptoms. She tolerated PO challenge without issue. Plan to d/c at this time.   Assessment and Plan  Nausea and vomiting during pregnancy - Plan: Discharge patient Rx for Zofran, Scopolamine sent Already on Diclegis equivalent Stable for d/c  Ptyalism - Plan: Discharge patient Better with Scopolamine  Heartburn during pregnancy in first trimester - Plan: Discharge patient  Better with Pepcid  Sundra Aland, MD OB Fellow, Faculty Practice [facility], Center for [facility]  12/13/2022, 5:35 PM

## 2022-12-24 ENCOUNTER — Other Ambulatory Visit: Payer: Self-pay | Admitting: *Deleted

## 2022-12-28 ENCOUNTER — Other Ambulatory Visit: Payer: Self-pay

## 2022-12-28 ENCOUNTER — Inpatient Hospital Stay (HOSPITAL_COMMUNITY)
Admission: AD | Admit: 2022-12-28 | Discharge: 2022-12-28 | Disposition: A | Discharge status: Home / Self Care | Payer: Medicaid Other | Attending: Obstetrics and Gynecology | Admitting: Obstetrics and Gynecology

## 2022-12-28 DIAGNOSIS — O26891 Other specified pregnancy related conditions, first trimester: Secondary | ICD-10-CM | POA: Diagnosis not present

## 2022-12-28 DIAGNOSIS — O09521 Supervision of elderly multigravida, first trimester: Secondary | ICD-10-CM | POA: Diagnosis not present

## 2022-12-28 DIAGNOSIS — R102 Pelvic and perineal pain: Secondary | ICD-10-CM | POA: Insufficient documentation

## 2022-12-28 DIAGNOSIS — Z3A09 9 weeks gestation of pregnancy: Secondary | ICD-10-CM | POA: Diagnosis not present

## 2022-12-28 DIAGNOSIS — N949 Unspecified condition associated with female genital organs and menstrual cycle: Secondary | ICD-10-CM

## 2022-12-28 DIAGNOSIS — O99211 Obesity complicating pregnancy, first trimester: Secondary | ICD-10-CM | POA: Diagnosis not present

## 2022-12-28 DIAGNOSIS — R519 Headache, unspecified: Secondary | ICD-10-CM

## 2022-12-28 DIAGNOSIS — O200 Threatened abortion: Secondary | ICD-10-CM | POA: Diagnosis not present

## 2022-12-28 DIAGNOSIS — O219 Vomiting of pregnancy, unspecified: Secondary | ICD-10-CM | POA: Insufficient documentation

## 2022-12-28 LAB — URINALYSIS, ROUTINE W REFLEX MICROSCOPIC
Bilirubin Urine: NEGATIVE
Glucose, UA: NEGATIVE mg/dL
Hgb urine dipstick: NEGATIVE
Ketones, ur: 5 mg/dL — AB
Leukocytes,Ua: NEGATIVE
Nitrite: NEGATIVE
Protein, ur: NEGATIVE mg/dL
Specific Gravity, Urine: 1.008 (ref 1.005–1.030)
pH: 8 (ref 5.0–8.0)

## 2022-12-28 MED ORDER — CALCIUM CARBONATE ANTACID 500 MG PO CHEW
2.0000 | CHEWABLE_TABLET | Freq: Once | ORAL | Status: AC
  Administered 2022-12-28: 400 mg via ORAL
  Filled 2022-12-28: qty 2

## 2022-12-28 MED ORDER — ACETAMINOPHEN 500 MG PO TABS
1000.0000 mg | ORAL_TABLET | Freq: Once | ORAL | Status: AC
  Administered 2022-12-28: 1000 mg via ORAL
  Filled 2022-12-28: qty 2

## 2022-12-28 NOTE — MAU Provider Note (Signed)
History     CSN: [id number]  Arrival date and time: 12/28/22 1747   Event Date/Time   First Provider Initiated Contact with Patient 12/28/22 1924      No chief complaint on file.  April Boone is a 37 y.o. [id number] at [redacted]w[redacted]d who plans to receive care at [facility] and has an appt scheduled for 01/07/2023.  She presents today for abdominal pain and HA.  Patient reports abdominal pain has been intermittent, but today it has worsened. She states the pain is on the left side and feels like "something is inside... like I am on my menses."  She states the pain lasts ~ 1 minute when it occurs.  She denies worsening or alleviating factors.  She rates the pain a 10/10 when it occurs.  Patient also reports HA was bad prior to arrival, but has improved. Patient denies vaginal bleeding or discharge. She also reports an urge to vomit and states it feels like "it is stuck in my throat and it burns."  Patient endorses fetal movement. Patient denies vaginal bleeding or discharge.    OB History     Gravida  5   Para  3   Term  3   Preterm      AB  1   Living  4      SAB  1   IAB      Ectopic      Multiple  1   Live Births  4           Past Medical History:  Diagnosis Date   Obese     No past surgical history on file.  Family History  Problem Relation Age of Onset   Healthy Mother    Healthy Father     Social History   Tobacco Use   Smoking status: Never   Smokeless tobacco: Never  Vaping Use   Vaping status: Never Used  Substance Use Topics   Alcohol use: Not Currently   Drug use: Not Currently    Allergies: No Known Allergies  Medications Prior to Admission  Medication Sig Dispense Refill Last Dose   acetaminophen (TYLENOL) 500 MG tablet Take 1-2 tablets (500-1,000 mg total) by mouth every 6 (six) hours as needed for headache. 100 tablet 0    doxylamine, Sleep, (UNISOM) 25 MG tablet Take 0.5 tablets (12.5 mg total) by mouth at bedtime as needed  (nausea/vomiting). 30 tablet 0    famotidine (PEPCID) 20 MG tablet Take 1 tablet (20 mg total) by mouth 2 (two) times daily. 60 tablet 0    ondansetron (ZOFRAN) 4 MG tablet Take 1 tablet (4 mg total) by mouth every 8 (eight) hours as needed for nausea or vomiting. 20 tablet 0    Prenatal Vit-Fe Fumarate-FA (PRENATAL VITAMINS) 28-0.8 MG TABS Take 1 tablet by mouth daily. 30 tablet 0    pyridOXINE (VITAMIN B6) 25 MG tablet Take 1 tablet (25 mg total) by mouth 3 (three) times daily as needed. 30 tablet 0    scopolamine (TRANSDERM-SCOP) 1 MG/3DAYS Place 1 patch (1.5 mg total) onto the skin every 3 (three) days. 10 patch 0     Review of Systems  Gastrointestinal:  Positive for abdominal pain (LLQ, intermittent). Negative for nausea and vomiting.  Genitourinary:  Negative for difficulty urinating, dysuria, vaginal bleeding and vaginal discharge.  Neurological:  Positive for headaches. Negative for dizziness and light-headedness.   Physical Exam   Blood pressure 113/66, pulse 84, temperature 98 F (36.7 C), temperature  source Oral, resp. rate 18, weight 85.5 kg, last menstrual period 10/11/2022, SpO2 100%.  Physical Exam Vitals and nursing note reviewed.  Constitutional:      Appearance: Normal appearance.  HENT:     Head: Normocephalic and atraumatic.  Eyes:     Conjunctiva/sclera: Conjunctivae normal.  Cardiovascular:     Rate and Rhythm: Normal rate.  Pulmonary:     Effort: Pulmonary effort is normal. No respiratory distress.  Abdominal:     Palpations: Abdomen is soft.     Tenderness: There is no abdominal tenderness.  Musculoskeletal:        General: Normal range of motion.     Cervical back: Normal range of motion.  Skin:    General: Skin is warm and dry.  Neurological:     Mental Status: She is alert and oriented to person, place, and time.  Psychiatric:        Mood and Affect: Mood normal.        Behavior: Behavior normal.     MAU Course  Procedures Results for orders  placed or performed during the hospital encounter of 12/28/22 (from the past 24 hour(s))  Urinalysis, Routine w reflex microscopic -Urine, Clean Catch     Status: Abnormal   Collection Time: 12/28/22  6:51 PM  Result Value Ref Range   Color, Urine STRAW (A) YELLOW   APPearance CLEAR CLEAR   Specific Gravity, Urine 1.008 1.005 - 1.030   pH 8.0 5.0 - 8.0   Glucose, UA NEGATIVE NEGATIVE mg/dL   Hgb urine dipstick NEGATIVE NEGATIVE   Bilirubin Urine NEGATIVE NEGATIVE   Ketones, ur 5 (A) NEGATIVE mg/dL   Protein, ur NEGATIVE NEGATIVE mg/dL   Nitrite NEGATIVE NEGATIVE   Leukocytes,Ua NEGATIVE NEGATIVE    MDM Exam Labs:UA Pain Medication Antacid Assessment and Plan  37 year old, [id number]  SIUP at 9.5 weeks Round Ligament Pain Headache  -Reviewed POC with patient. -Exam performed and findings discussed.  -Informed that round ligament pain is normal occurrence during pregnancy.  -Patient offered and accepts pain medication. -Will give tylenol for HA. -Patient also reporting heartburn and agreeable to tums.  -Discussed taking pepcid, which was previously prescribed, for this. -Patient states she would like to be discharged after receiving medications. -Precautions reviewed. -Will give list of pregnancy safe medications. -Discharge to home once medications received per patient request.   Cherre Robins 12/28/2022, 7:24 PM

## 2022-12-28 NOTE — MAU Note (Signed)
April Boone is a 37 y.o. at [redacted]w[redacted]d here in MAU reporting: she's having a HA and left lower abdominal that began this afternoon.  States hadn't taken any meds to treat.  Reports abdominal pain is intermittent cramping in LLQ. Denies VB. LMP: NA Onset of complaint: today Pain score: 9 abdomen & 8 HA Vitals:   12/28/22 1848  BP: 113/66  Pulse: 84  Resp: 18  Temp: 98 F (36.7 C)  SpO2: 100%     FHT:NA Lab orders placed from triage:   UA

## 2022-12-29 ENCOUNTER — Encounter (HOSPITAL_COMMUNITY): Payer: Self-pay | Admitting: Obstetrics and Gynecology

## 2022-12-29 ENCOUNTER — Inpatient Hospital Stay (HOSPITAL_COMMUNITY)
Admission: AD | Admit: 2022-12-29 | Discharge: 2022-12-29 | Disposition: A | Discharge status: Home / Self Care | Payer: Medicaid Other | Attending: Obstetrics and Gynecology | Admitting: Obstetrics and Gynecology

## 2022-12-29 DIAGNOSIS — O200 Threatened abortion: Secondary | ICD-10-CM

## 2022-12-29 DIAGNOSIS — Z3A09 9 weeks gestation of pregnancy: Secondary | ICD-10-CM

## 2022-12-29 DIAGNOSIS — O26891 Other specified pregnancy related conditions, first trimester: Secondary | ICD-10-CM | POA: Insufficient documentation

## 2022-12-29 DIAGNOSIS — K117 Disturbances of salivary secretion: Secondary | ICD-10-CM | POA: Diagnosis not present

## 2022-12-29 DIAGNOSIS — O09521 Supervision of elderly multigravida, first trimester: Secondary | ICD-10-CM | POA: Insufficient documentation

## 2022-12-29 DIAGNOSIS — O209 Hemorrhage in early pregnancy, unspecified: Secondary | ICD-10-CM | POA: Insufficient documentation

## 2022-12-29 DIAGNOSIS — O99211 Obesity complicating pregnancy, first trimester: Secondary | ICD-10-CM | POA: Insufficient documentation

## 2022-12-29 LAB — URINALYSIS, ROUTINE W REFLEX MICROSCOPIC
Bilirubin Urine: NEGATIVE
Glucose, UA: NEGATIVE mg/dL
Ketones, ur: 80 mg/dL — AB
Leukocytes,Ua: NEGATIVE
Nitrite: NEGATIVE
Protein, ur: NEGATIVE mg/dL
Specific Gravity, Urine: 1.015 (ref 1.005–1.030)
pH: 6 (ref 5.0–8.0)

## 2022-12-29 LAB — WET PREP, GENITAL
Clue Cells Wet Prep HPF POC: NONE SEEN
Sperm: NONE SEEN
Trich, Wet Prep: NONE SEEN
WBC, Wet Prep HPF POC: <10 (ref <10)
Yeast Wet Prep HPF POC: NONE SEEN

## 2022-12-29 MED ORDER — ONDANSETRON 4 MG PO TBDP
8.0000 mg | ORAL_TABLET | Freq: Three times a day (TID) | ORAL | Status: DC | PRN
  Administered 2022-12-29: 8 mg via ORAL
  Filled 2022-12-29: qty 2

## 2022-12-29 MED ORDER — ONDANSETRON HCL 4 MG PO TABS: 4.0000 mg | ORAL_TABLET | Freq: Three times a day (TID) | ORAL | 3 refills | Status: DC | PRN

## 2022-12-29 MED ORDER — SCOPOLAMINE 1 MG/3DAYS TD PT72: 1.0000 | MEDICATED_PATCH | TRANSDERMAL | 3 refills | Status: DC

## 2022-12-29 MED ORDER — PROMETHAZINE HCL 25 MG PO TABS: 25.0000 mg | ORAL_TABLET | Freq: Four times a day (QID) | ORAL | 3 refills | Status: DC | PRN

## 2022-12-29 MED ORDER — ONDANSETRON HCL 4 MG PO TABS: 4.0000 mg | ORAL_TABLET | Freq: Three times a day (TID) | ORAL | 0 refills | Status: DC | PRN

## 2022-12-29 NOTE — MAU Note (Signed)
April Boone is a 37 y.o. at [redacted]w[redacted]d here in MAU reporting: is having pain in abd, started while sleeping.  When she used washroom this morning, she saw blood, scant amt , when she wipes.  Denies constipation or diarrhea. Pt spitting, not vomiting  Onset of complaint: during the night Pain score: 9, comes and goes, worse than last night Vitals:   12/29/22 1331  BP: (!) 123/56  Pulse: 84  Resp: 20  Temp: 97.9 F (36.6 C)  SpO2: 100%      Lab orders placed from triage:

## 2022-12-29 NOTE — MAU Provider Note (Cosign Needed Addendum)
History     CSN: [id number]  Arrival date and time: 12/29/22 1234   Event Date/Time   First Provider Initiated Contact with Patient 12/29/22 1440      Chief Complaint  Patient presents with   Abdominal Pain   Vaginal Bleeding   Abdominal pain started yesterday on her L flank. She went home, slept good painless. This morning, pain resurfaced towards her mid abdomen, periumbilical. She used the rest room and found dark blood this morning that turned bright red after wiping several hours later today. The pain is intermittent in nature, crampy characteristics. She states this is similar to her menses.   Denies fevers, headaches, difficulty breathing, dysuria, hematuria, or any other symptoms.     OB History     Gravida  5   Para  3   Term  3   Preterm      AB  1   Living  4      SAB  1   IAB      Ectopic      Multiple  1   Live Births  4           Past Medical History:  Diagnosis Date   Obese     Past Surgical History:  Procedure Laterality Date   NO PAST SURGERIES      Family History  Problem Relation Age of Onset   Healthy Mother    Healthy Father     Social History   Tobacco Use   Smoking status: Never   Smokeless tobacco: Never  Vaping Use   Vaping status: Never Used  Substance Use Topics   Alcohol use: Not Currently   Drug use: Not Currently    Allergies: No Known Allergies  Medications Prior to Admission  Medication Sig Dispense Refill Last Dose   acetaminophen (TYLENOL) 500 MG tablet Take 1-2 tablets (500-1,000 mg total) by mouth every 6 (six) hours as needed for headache. 100 tablet 0 12/28/2022   doxylamine, Sleep, (UNISOM) 25 MG tablet Take 0.5 tablets (12.5 mg total) by mouth at bedtime as needed (nausea/vomiting). 30 tablet 0 Past Week   famotidine (PEPCID) 20 MG tablet Take 1 tablet (20 mg total) by mouth 2 (two) times daily. 60 tablet 0 12/28/2022   ondansetron (ZOFRAN) 4 MG tablet Take 1 tablet (4 mg total) by mouth  every 8 (eight) hours as needed for nausea or vomiting. 20 tablet 0 12/29/2022   Prenatal Vit-Fe Fumarate-FA (PRENATAL VITAMINS) 28-0.8 MG TABS Take 1 tablet by mouth daily. 30 tablet 0 12/29/2022   pyridOXINE (VITAMIN B6) 25 MG tablet Take 1 tablet (25 mg total) by mouth 3 (three) times daily as needed. 30 tablet 0 12/29/2022   scopolamine (TRANSDERM-SCOP) 1 MG/3DAYS Place 1 patch (1.5 mg total) onto the skin every 3 (three) days. 10 patch 0 Unknown     Physical Exam   Blood pressure (!) 123/56, pulse 84, temperature 97.9 F (36.6 C), temperature source Oral, resp. rate 20, height 5\' 4"  (1.626 m), weight 85.1 kg, last menstrual period 10/11/2022, SpO2 100%.  Physical Exam Vitals and nursing note reviewed. Exam conducted with a chaperone present.  Constitutional:      General: She is not in acute distress.    Appearance: She is not ill-appearing or toxic-appearing.  HENT:     Head: Normocephalic and atraumatic.     Mouth/Throat:     Mouth: Mucous membranes are moist.  Eyes:     Extraocular Movements: Extraocular movements intact.  Cardiovascular:     Rate and Rhythm: Normal rate and regular rhythm.  Pulmonary:     Effort: Pulmonary effort is normal.     Breath sounds: Normal breath sounds. No wheezing, rhonchi or rales.  Abdominal:     General: Abdomen is flat. Bowel sounds are normal. There is no distension.     Palpations: Abdomen is soft.     Tenderness: There is abdominal tenderness in the periumbilical area and left lower quadrant. There is no guarding or rebound.  Genitourinary:    General: Normal vulva.     Exam position: Lithotomy position.     Labia:        Right: No rash, tenderness, lesion or injury.        Left: No rash, tenderness, lesion or injury.      Vagina: Normal. No vaginal discharge, erythema or bleeding.     Cervix: No discharge, friability, lesion, erythema or cervical bleeding.     Comments: Cervix is visually closed.  BME deferred.  Skin:     Capillary Refill: Capillary refill takes less than 2 seconds.  Neurological:     General: No focal deficit present.     Mental Status: She is alert and oriented to person, place, and time.  Psychiatric:        Mood and Affect: Mood normal.        Behavior: Behavior normal.     BSUS with IUP 152 FHR  MAU Course   Results for orders placed or performed during the hospital encounter of 12/29/22 (from the past 24 hour(s))  Wet prep, genital     Status: None   Collection Time: 12/29/22  3:28 PM   Specimen: Cervix  Result Value Ref Range   Yeast Wet Prep HPF POC NONE SEEN NONE SEEN   Trich, Wet Prep NONE SEEN NONE SEEN   Clue Cells Wet Prep HPF POC NONE SEEN NONE SEEN   WBC, Wet Prep HPF POC <10 <10   Sperm NONE SEEN   Urinalysis, Routine w reflex microscopic -Urine, Clean Catch     Status: Abnormal   Collection Time: 12/29/22  3:44 PM  Result Value Ref Range   Color, Urine YELLOW YELLOW   APPearance CLEAR CLEAR   Specific Gravity, Urine 1.015 1.005 - 1.030   pH 6.0 5.0 - 8.0   Glucose, UA NEGATIVE NEGATIVE mg/dL   Hgb urine dipstick SMALL (A) NEGATIVE   Bilirubin Urine NEGATIVE NEGATIVE   Ketones, ur 80 (A) NEGATIVE mg/dL   Protein, ur NEGATIVE NEGATIVE mg/dL   Nitrite NEGATIVE NEGATIVE   Leukocytes,Ua NEGATIVE NEGATIVE   RBC / HPF 0-5 0 - 5 RBC/hpf   WBC, UA 0-5 0 - 5 WBC/hpf   Bacteria, UA RARE (A) NONE SEEN   Squamous Epithelial / HPF 0-5 0 - 5 /HPF   Mucus PRESENT      MDM  Orders Placed This Encounter  Procedures   Wet prep, genital    Standing Status:   Standing    Number of Occurrences:   1   Urinalysis, Routine w reflex microscopic -Urine, Clean Catch    Standing Status:   Standing    Number of Occurrences:   1    Order Specific Question:   Specimen Source    Answer:   Urine, Clean Catch [76]     Assessment and Plan  37y [id number] F presenting for evaluation of vaginal bleeding.  SIUP at [redacted]w[redacted]d FHR present 152 on BSUS. Hemodynamically stable. SSE without  active bleeding from cervical os. No blood in vaginal vault. Cervical os visually closed. Wet prep is unremarkable.  First trimester bleeding - threatened abortion BSUS FHR 152 2.  Sialorrhea   continue transdermal scopolamine Q3days PRN for increased oral secretions, nausea.   -Exam findings discussed. Reported bleed concerning for threatened abortion. No active bleeding from cervical os. No pooling of blood on vaginal vault. Cervix not erythematous or friable. - G/C, UA pending, will reach back with results.  Medically stable for discharge home. Return precautions discussed with patient. Follow up with OB for [facility] establishment. Appt scheduled 01/07/23 at [facility].     Charma Igo, MD 12/29/2022, 2:41 PM   I was present for and verified the exam and agree with above.  Katrinka Blazing, [place], CNM 12/29/2022 10:12 PM

## 2022-12-30 LAB — GC/CHLAMYDIA PROBE AMP (~~LOC~~) NOT AT ARMC
Chlamydia: NEGATIVE
Comment: NEGATIVE
Comment: NORMAL
Neisseria Gonorrhea: NEGATIVE

## 2023-01-07 ENCOUNTER — Other Ambulatory Visit (INDEPENDENT_AMBULATORY_CARE_PROVIDER_SITE_OTHER): Payer: Medicaid Other

## 2023-01-07 ENCOUNTER — Ambulatory Visit: Payer: Medicaid Other | Admitting: *Deleted

## 2023-01-07 VITALS — BP 117/79 | HR 109 | Wt 185.2 lb

## 2023-01-07 DIAGNOSIS — Z3481 Encounter for supervision of other normal pregnancy, first trimester: Secondary | ICD-10-CM | POA: Diagnosis not present

## 2023-01-07 DIAGNOSIS — O3680X0 Pregnancy with inconclusive fetal viability, not applicable or unspecified: Secondary | ICD-10-CM

## 2023-01-07 DIAGNOSIS — Z3480 Encounter for supervision of other normal pregnancy, unspecified trimester: Secondary | ICD-10-CM | POA: Insufficient documentation

## 2023-01-07 DIAGNOSIS — Z3A11 11 weeks gestation of pregnancy: Secondary | ICD-10-CM

## 2023-01-07 DIAGNOSIS — Z1339 Encounter for screening examination for other mental health and behavioral disorders: Secondary | ICD-10-CM

## 2023-01-07 DIAGNOSIS — O0990 Supervision of high risk pregnancy, unspecified, unspecified trimester: Secondary | ICD-10-CM | POA: Insufficient documentation

## 2023-01-07 MED ORDER — PANTOPRAZOLE SODIUM 20 MG PO TBEC: 20.0000 mg | DELAYED_RELEASE_TABLET | Freq: Every day | ORAL | 5 refills | Status: DC

## 2023-01-07 NOTE — Progress Notes (Addendum)
New OB Intake  I connected with April Boone  on 01/07/23 at  9:15 AM EST by In PersonVisit and verified that I am speaking with the correct person using two identifiers. Nurse is located at [facility] and pt is located at [facility].  I discussed the limitations, risks, security and privacy concerns of performing an evaluation and management service by telephone and the availability of in person appointments. I also discussed with the patient that there may be a patient responsible charge related to this service. The patient expressed understanding and agreed to proceed.  I explained I am completing New OB Intake today. We discussed EDD of 07/28/2023, by Ultrasound. Pt is [id number]. I reviewed her allergies, medications and Medical/Surgical/OB history.    Patient Active Problem List   Diagnosis Date Noted   Supervision of other normal pregnancy, antepartum 01/07/2023    Concerns addressed today  Delivery Plans Plans to deliver at [facility]. Discussed the nature of our practice with multiple providers including residents and students. Due to the size of the practice, the delivering provider may not be the same as those providing prenatal care.   Patient is not interested in water birth. Offered upcoming OB visit with CNM to discuss further.  MyChart/Babyscripts MyChart access verified. I explained pt will have some visits in office and some virtually. Babyscripts instructions given and order placed. Patient verifies receipt of registration text/e-mail. Account successfully created and app downloaded.  Blood Pressure Cuff/Weight Scale Blood pressure cuff ordered for patient to pick-up from [employer]. Explained after first prenatal appt pt will check weekly and document in Babyscripts. Patient does not have weight scale; patient may purchase if they desire to track weight weekly in Babyscripts.  Anatomy US Explained first scheduled [place] will be around 19 weeks. Anatomy US scheduled for TBD  at TBD.  Interested in [place]? If yes, send referral and doula dot phrase.   Is patient a candidate for Babyscripts Optimization? Yes  First visit review I reviewed new OB appt with patient. Explained pt will be seen by Dr. Jolayne Panther at first visit. Discussed Avelina Laine genetic screening with patient. Requests Panorama and Horizon.. Routine prenatal labs  collected at today's visit.    Last Pap Diagnosis  Date Value Ref Range Status  07/30/2020   Final   - Negative for intraepithelial lesion or malignancy (NILM)    Harrel Lemon, RN 01/07/2023  10:16 AM

## 2023-01-07 NOTE — Patient Instructions (Signed)

## 2023-01-08 LAB — CBC/D/PLT+RPR+RH+ABO+RUBIGG...
Antibody Screen: NEGATIVE
Basophils Absolute: 0 10*3/uL (ref 0.0–0.2)
Basos: 0 %
EOS (ABSOLUTE): 0.1 10*3/uL (ref 0.0–0.4)
Eos: 2 %
HCV Ab: NONREACTIVE
HIV Screen 4th Generation wRfx: NONREACTIVE
Hematocrit: 36.4 % (ref 34.0–46.6)
Hemoglobin: 12 g/dL (ref 11.1–15.9)
Hepatitis B Surface Ag: NEGATIVE
Immature Grans (Abs): 0 10*3/uL (ref 0.0–0.1)
Immature Granulocytes: 0 %
Lymphocytes Absolute: 1.4 10*3/uL (ref 0.7–3.1)
Lymphs: 22 %
MCH: 28.6 pg (ref 26.6–33.0)
MCHC: 33 g/dL (ref 31.5–35.7)
MCV: 87 fL (ref 79–97)
Monocytes Absolute: 0.4 10*3/uL (ref 0.1–0.9)
Monocytes: 7 %
Neutrophils Absolute: 4.4 10*3/uL (ref 1.4–7.0)
Neutrophils: 69 %
Platelets: 183 10*3/uL (ref 150–450)
RBC: 4.2 x10E6/uL (ref 3.77–5.28)
RDW: 13.9 % (ref 11.7–15.4)
RPR Ser Ql: NONREACTIVE
Rh Factor: POSITIVE
Rubella Antibodies, IGG: 3.17 {index} (ref >0.99)
WBC: 6.3 10*3/uL (ref 3.4–10.8)

## 2023-01-08 LAB — HCV INTERPRETATION

## 2023-01-08 LAB — HEMOGLOBIN A1C
Est. average glucose Bld gHb Est-mCnc: 126 mg/dL
Hgb A1c MFr Bld: 6 % — ABNORMAL HIGH (ref 4.8–5.6)

## 2023-01-09 LAB — URINE CULTURE, OB REFLEX

## 2023-01-09 LAB — CULTURE, OB URINE

## 2023-01-14 LAB — PANORAMA PRENATAL TEST FULL PANEL:PANORAMA TEST PLUS 5 ADDITIONAL MICRODELETIONS: FETAL FRACTION: 7.5

## 2023-02-10 ENCOUNTER — Other Ambulatory Visit (HOSPITAL_COMMUNITY)
Admission: RE | Admit: 2023-02-10 | Discharge: 2023-02-10 | Disposition: A | Discharge status: Home / Self Care | Payer: Medicaid Other | Source: Ambulatory Visit | Attending: Obstetrics and Gynecology | Admitting: Obstetrics and Gynecology

## 2023-02-10 ENCOUNTER — Ambulatory Visit (INDEPENDENT_AMBULATORY_CARE_PROVIDER_SITE_OTHER): Payer: Medicaid Other | Admitting: Obstetrics and Gynecology

## 2023-02-10 ENCOUNTER — Encounter: Payer: Self-pay | Admitting: Obstetrics and Gynecology

## 2023-02-10 VITALS — BP 127/83 | HR 123 | Wt 188.0 lb

## 2023-02-10 DIAGNOSIS — Z1339 Encounter for screening examination for other mental health and behavioral disorders: Secondary | ICD-10-CM | POA: Diagnosis not present

## 2023-02-10 DIAGNOSIS — O09522 Supervision of elderly multigravida, second trimester: Secondary | ICD-10-CM | POA: Diagnosis not present

## 2023-02-10 DIAGNOSIS — Z3A16 16 weeks gestation of pregnancy: Secondary | ICD-10-CM | POA: Insufficient documentation

## 2023-02-10 DIAGNOSIS — Z3480 Encounter for supervision of other normal pregnancy, unspecified trimester: Secondary | ICD-10-CM

## 2023-02-10 DIAGNOSIS — Z3482 Encounter for supervision of other normal pregnancy, second trimester: Secondary | ICD-10-CM | POA: Insufficient documentation

## 2023-02-10 DIAGNOSIS — O99210 Obesity complicating pregnancy, unspecified trimester: Secondary | ICD-10-CM | POA: Insufficient documentation

## 2023-02-10 DIAGNOSIS — O99212 Obesity complicating pregnancy, second trimester: Secondary | ICD-10-CM

## 2023-02-10 DIAGNOSIS — O09529 Supervision of elderly multigravida, unspecified trimester: Secondary | ICD-10-CM | POA: Insufficient documentation

## 2023-02-10 MED ORDER — ASPIRIN 81 MG PO TBEC: 81.0000 mg | DELAYED_RELEASE_TABLET | Freq: Every day | ORAL | 2 refills | Status: DC

## 2023-02-10 MED ORDER — PRENATAL VITAMINS 28-0.8 MG PO TABS: 1.0000 | ORAL_TABLET | Freq: Every day | ORAL | 12 refills | Status: AC

## 2023-02-10 NOTE — Progress Notes (Signed)
NOB , c/o headache 8/10, left leg vein pain.

## 2023-02-10 NOTE — Progress Notes (Signed)
  Subjective:    April Boone is a [id number] [redacted]w[redacted]d being seen today for her first obstetrical visit.  Her obstetrical history is significant for advanced maternal age and obesity. Patient does intend to breast feed. Pregnancy history fully reviewed.  Patient reports hip discomfort.  Vitals:   02/10/23 1110  BP: 127/83  Pulse: (!) 123  Weight: 188 lb (85.3 kg)    HISTORY: OB History  Gravida Para Term Preterm AB Living  5 3 3   1 4   SAB IAB Ectopic Multiple Live Births  1     1 4     # Outcome Date GA Lbr Len/2nd Weight Sex Type Anes PTL Lv  5 Current           4 SAB 06/2020          3 Term 04/2012     Vag-Spont   LIV  2 Term 03/02/09 [redacted]w[redacted]d   M Vag-Spont None  LIV  1A Term 02/14/07     Vag-Spont   LIV  1B Term 02/14/07     Vag-Spont  N LIV   Past Medical History:  Diagnosis Date   Medical history non-contributory    Obese    Past Surgical History:  Procedure Laterality Date   NO PAST SURGERIES     Family History  Problem Relation Age of Onset   Healthy Mother    Healthy Father      Exam    Uterus:   16-weeks  Pelvic Exam:    Perineum: No Hemorrhoids, Normal Perineum   Vulva: normal   Vagina:  normal mucosa, normal discharge   pH:    Cervix: multiparous appearance and cervix is closed and long   Adnexa: no mass, fullness, tenderness   Bony Pelvis: gynecoid  System: Breast:  normal appearance, no masses or tenderness   Skin: normal coloration and turgor, no rashes    Neurologic: oriented, no focal deficits   Extremities: normal strength, tone, and muscle mass   HEENT extra ocular movement intact   Mouth/Teeth mucous membranes moist, pharynx normal without lesions and dental hygiene good   Neck supple and no masses   Cardiovascular: regular rate and rhythm   Respiratory:  appears well, vitals normal, no respiratory distress, acyanotic, normal RR, chest clear, no wheezing, crepitations, rhonchi, normal symmetric air entry   Abdomen: soft, non-tender;  bowel sounds normal; no masses,  no organomegaly   Urinary:       Assessment:    Pregnancy: [id number] Patient Active Problem List   Diagnosis Date Noted   Supervision of other normal pregnancy, antepartum 01/07/2023        Plan:     Initial labs drawn. Prenatal vitamins. Problem list reviewed and updated. Encouraged the use of a maternity belt Genetic Screening discussed : results reviewed.  Ultrasound discussed; fetal survey: ordered. AFP today Rx ASA provided  Follow up in 4 weeks. 50% of 30 min visit spent on counseling and coordination of care.     April Boone 02/10/2023

## 2023-02-10 NOTE — Patient Instructions (Signed)

## 2023-02-12 LAB — AFP, SERUM, OPEN SPINA BIFIDA
AFP MoM: 0.73
AFP Value: 23 ng/mL
Gest. Age on Collection Date: 16 wk
Maternal Age At EDD: 37.8 a
OSBR Risk 1 IN: 10000
Test Results:: NEGATIVE
Weight: 188 [lb_av]

## 2023-02-12 LAB — CYTOLOGY - PAP
Comment: NEGATIVE
Diagnosis: NEGATIVE
High risk HPV: NEGATIVE

## 2023-03-03 NOTE — L&D Delivery Note (Signed)
 OB/GYN Faculty Practice Delivery Note  Valaria Delsignore is a 38 y.o. [id number] s/p VAVD at [redacted]w[redacted]d. She was admitted for IOL poorly controlled GDM.   ROM: 25h 10m with clear fluid GBS Status:  Positive/-- (03/17 0000) Maximum Maternal Temperature: 98.62F  Labor Progress: Initial SVE: 1/T/-2. She received Cytotec that was removed after 2/2 prolonged deceleration, AROM with FSE, Pitocin.  She had another prolonged deceleration necessitating pause of pitocin momentarily before being able to be started again. She then progressed to complete.   Delivery Date/Time: 1039 5/10 Delivery: Called to room and patient was complete and pushing.   Operative Delivery Note  Indication for operative vaginal delivery: Non-reassuring Fetal Heart Tracing  Patient was examined and found to be fully dilated with fetal station of +2.  Patient's bladder was noted to be empty, and there were no known fetal contraindications to operative vaginal delivery. EFW was 62 on recent ultrasound.  FHR tracing remarkable for recurrent deceleration  Risks of vacuum assistance were discussed in detail, including but not limited to, bleeding, infection, damage to maternal tissues, fetal cephalohematoma, inability to effect vaginal delivery of the head or shoulder dystocia that cannot be resolved by established maneuvers and need for emergency cesarean section.  Patient gave verbal consent.  The soft vacuum cup was positioned over the sagittal suture 3 cm anterior to posterior fontanelle.  Pressure was then increased to the green, and the patient was instructed to push.  Pulling was administered along the pelvic curve while patient was pushing; there were 1 contractions and 0 popoffs.  Vacuum was reduced as head delivered.  Head DOA w/o restitution.  Tried restituting to maternal L w/o relief, checked posterior shoulder which found to be moving to maternal R so attempted and was successful at restituting to maternal R.  With pt  fully supine was able to deliver anterior with gentle downward traction.  No nuchal. The infant was then delivered atraumatically, noted to be a viable female infant, Apgars of 8 and 9.  Neonatology present for delivery.  There was spontaneous placental delivery, intact with three-vessel cord.  Intact perineum. EBL120cc, epidural anesthesia.   Sponge, instrument and needle counts were correct x 2.  The patient and baby were stable after delivery and remained in couplet care, with plans to transfer later to postpartum unit  Baby Weight: pending  Placenta: 3 vessel, intact. Sent to L&D Complications: None Lacerations: None EBL: 120 mL Analgesia: Epidural   Infant:  APGAR (1 MIN):  8 APGAR (5 MINS):  9  Ebony Goldstein, MD [facility] Family Medicine Fellow, [facility] for [facility], [facility] 07/10/2023, 11:02 AM

## 2023-03-09 ENCOUNTER — Ambulatory Visit: Payer: Medicaid Other | Attending: Obstetrics and Gynecology

## 2023-03-09 ENCOUNTER — Ambulatory Visit: Payer: Medicaid Other | Admitting: *Deleted

## 2023-03-09 ENCOUNTER — Other Ambulatory Visit: Payer: Self-pay | Admitting: *Deleted

## 2023-03-09 ENCOUNTER — Ambulatory Visit: Payer: Medicaid Other | Attending: Obstetrics and Gynecology | Admitting: Obstetrics

## 2023-03-09 ENCOUNTER — Encounter: Payer: Self-pay | Admitting: *Deleted

## 2023-03-09 DIAGNOSIS — O99212 Obesity complicating pregnancy, second trimester: Secondary | ICD-10-CM | POA: Insufficient documentation

## 2023-03-09 DIAGNOSIS — O09529 Supervision of elderly multigravida, unspecified trimester: Secondary | ICD-10-CM | POA: Insufficient documentation

## 2023-03-09 DIAGNOSIS — O09522 Supervision of elderly multigravida, second trimester: Secondary | ICD-10-CM | POA: Diagnosis not present

## 2023-03-09 DIAGNOSIS — Z3480 Encounter for supervision of other normal pregnancy, unspecified trimester: Secondary | ICD-10-CM | POA: Diagnosis present

## 2023-03-09 DIAGNOSIS — O321XX0 Maternal care for breech presentation, not applicable or unspecified: Secondary | ICD-10-CM | POA: Diagnosis not present

## 2023-03-09 DIAGNOSIS — Z363 Encounter for antenatal screening for malformations: Secondary | ICD-10-CM | POA: Insufficient documentation

## 2023-03-09 DIAGNOSIS — Z3A19 19 weeks gestation of pregnancy: Secondary | ICD-10-CM

## 2023-03-09 DIAGNOSIS — O43199 Other malformation of placenta, unspecified trimester: Secondary | ICD-10-CM

## 2023-03-09 DIAGNOSIS — Z362 Encounter for other antenatal screening follow-up: Secondary | ICD-10-CM

## 2023-03-09 NOTE — Consult Note (Signed)
 MFM Note  April Boone is currently at 19 weeks and 6 days.  She was seen due to advanced maternal age (38 years old) and maternal obesity.    She denies any significant past medical history and denies any problems in her current pregnancy.    She had a cell free DNA test earlier in her pregnancy which indicated a low risk for trisomy 64, 35, and 13. A female fetus is predicted.   She was informed that the fetal growth and amniotic fluid level were appropriate for her gestational age.   On today's exam, an intracardiac echogenic focus was noted in the left ventricle of the fetal heart.    The small association between an echogenic focus and Down syndrome was discussed.   Due to the echogenic focus noted today, the patient was offered and declined an amniocentesis today for definitive diagnosis of fetal aneuploidy.  She reports that she is comfortable with her negative cell free DNA test.  The views of the fetal anatomy were limited today due to the fetal position and maternal body habitus.  The patient was informed that anomalies may be missed due to technical limitations. If the fetus is in a suboptimal position or maternal habitus is increased, visualization of the fetus in the maternal uterus may be impaired.  A marginal placental cord insertion was noted today.  The small association of fetal growth restriction associated with a marginal cord insertion was discussed.  Due to this finding, we will continue to follow her with growth ultrasounds throughout her pregnancy.   A follow-up exam was scheduled in 4 weeks to assess the fetal growth and to complete the views of the fetal anatomy.    The patient stated that all of her questions were answered today.  A total of 30 minutes was spent counseling and coordinating the care for this patient.  Greater than 50% of the time was spent in direct face-to-face contact.

## 2023-03-09 NOTE — Progress Notes (Deleted)
 MFM Note  April Boone is currently at 19 weeks and 6 days.  She was seen due to advanced maternal age (38 years old) and maternal obesity.    She denies any significant past medical history and denies any problems in her current pregnancy.    She had a cell free DNA test earlier in her pregnancy which indicated a low risk for trisomy 64, 35, and 13. A female fetus is predicted.   She was informed that the fetal growth and amniotic fluid level were appropriate for her gestational age.   On today's exam, an intracardiac echogenic focus was noted in the left ventricle of the fetal heart.    The small association between an echogenic focus and Down syndrome was discussed.   Due to the echogenic focus noted today, the patient was offered and declined an amniocentesis today for definitive diagnosis of fetal aneuploidy.  She reports that she is comfortable with her negative cell free DNA test.  The views of the fetal anatomy were limited today due to the fetal position and maternal body habitus.  The patient was informed that anomalies may be missed due to technical limitations. If the fetus is in a suboptimal position or maternal habitus is increased, visualization of the fetus in the maternal uterus may be impaired.  A marginal placental cord insertion was noted today.  The small association of fetal growth restriction associated with a marginal cord insertion was discussed.  Due to this finding, we will continue to follow her with growth ultrasounds throughout her pregnancy.   A follow-up exam was scheduled in 4 weeks to assess the fetal growth and to complete the views of the fetal anatomy.    The patient stated that all of her questions were answered today.  A total of 30 minutes was spent counseling and coordinating the care for this patient.  Greater than 50% of the time was spent in direct face-to-face contact.

## 2023-03-09 NOTE — Progress Notes (Signed)
 MFM Note  April Boone is currently at 19 weeks and 6 days.  She was seen due to advanced maternal age (38 years old) and maternal obesity.    She denies any significant past medical history and denies any problems in her current pregnancy.    She had a cell free DNA test earlier in her pregnancy which indicated a low risk for trisomy 64, 35, and 13. A female fetus is predicted.   She was informed that the fetal growth and amniotic fluid level were appropriate for her gestational age.   On today's exam, an intracardiac echogenic focus was noted in the left ventricle of the fetal heart.    The small association between an echogenic focus and Down syndrome was discussed.   Due to the echogenic focus noted today, the patient was offered and declined an amniocentesis today for definitive diagnosis of fetal aneuploidy.  She reports that she is comfortable with her negative cell free DNA test.  The views of the fetal anatomy were limited today due to the fetal position and maternal body habitus.  The patient was informed that anomalies may be missed due to technical limitations. If the fetus is in a suboptimal position or maternal habitus is increased, visualization of the fetus in the maternal uterus may be impaired.  A marginal placental cord insertion was noted today.  The small association of fetal growth restriction associated with a marginal cord insertion was discussed.  Due to this finding, we will continue to follow her with growth ultrasounds throughout her pregnancy.   A follow-up exam was scheduled in 4 weeks to assess the fetal growth and to complete the views of the fetal anatomy.    The patient stated that all of her questions were answered today.  A total of 30 minutes was spent counseling and coordinating the care for this patient.  Greater than 50% of the time was spent in direct face-to-face contact.

## 2023-03-10 ENCOUNTER — Telehealth: Payer: Self-pay | Admitting: Licensed Clinical Social Worker

## 2023-03-10 ENCOUNTER — Ambulatory Visit (INDEPENDENT_AMBULATORY_CARE_PROVIDER_SITE_OTHER): Payer: Medicaid Other | Admitting: Obstetrics and Gynecology

## 2023-03-10 VITALS — BP 119/79 | HR 124 | Wt 190.8 lb

## 2023-03-10 DIAGNOSIS — O09529 Supervision of elderly multigravida, unspecified trimester: Secondary | ICD-10-CM

## 2023-03-10 DIAGNOSIS — Z3A20 20 weeks gestation of pregnancy: Secondary | ICD-10-CM

## 2023-03-10 DIAGNOSIS — Z3480 Encounter for supervision of other normal pregnancy, unspecified trimester: Secondary | ICD-10-CM

## 2023-03-10 DIAGNOSIS — O43199 Other malformation of placenta, unspecified trimester: Secondary | ICD-10-CM | POA: Insufficient documentation

## 2023-03-10 NOTE — Telephone Encounter (Signed)
 [facility] contacted patient on this date to provide [facility] intro and to schedule [facility] appointment. A VM was left.

## 2023-03-10 NOTE — Progress Notes (Addendum)
   PRENATAL VISIT NOTE  Subjective:  April Boone is a 38 y.o. [id number] at [redacted]w[redacted]d being seen today for ongoing prenatal care.  She is currently monitored for the following issues for this low-risk pregnancy and has Supervision of other normal pregnancy, antepartum; AMA (advanced maternal age) multigravida 35+; and Maternal obesity affecting pregnancy, antepartum on their problem list.  Patient reports  reports cold intolerance, some shortness of breath and fatigue . Reports headache, has not tried anything for it, no visual changes Contractions: Not present. Vag. Bleeding: None.  Movement: Present. Denies leaking of fluid.   The following portions of the patient's history were reviewed and updated as appropriate: allergies, current medications, past family history, past medical history, past social history, past surgical history and problem list.   Objective:   Vitals:   03/10/23 1016  BP: 119/79  Pulse: (!) 124  Weight: 190 lb 12.8 oz (86.5 kg)    Fetal Status: Fetal Heart Rate (bpm): 140   Movement: Present     General:  Alert, oriented and cooperative. Patient is in no acute distress.  Skin: Skin is warm and dry. No rash noted.   Cardiovascular: Normal heart rate noted  Respiratory: Normal respiratory effort, no problems with respiration noted  Abdomen: Soft, gravid, appropriate for gestational age.  Pain/Pressure: Present     Pelvic: Cervical exam deferred        Extremities: Normal range of motion.  Edema: None  Mental Status: Normal mood and affect. Normal behavior. Normal judgment and thought content.   Assessment and Plan:  Pregnancy: [id number] at [redacted]w[redacted]d 1. Supervision of other normal pregnancy, antepartum (Primary) BP and FHR normal Doing well, feeling regular movement    2. Antepartum multigravida of advanced maternal age   30. [redacted] weeks gestation of pregnancy Anticipatory guidance regarding upcoming appts Given tylenol  in clinic for headache, encouraged tylenol  PRN  and follow up if not relieving HA, or experiencing visual changes   -TSH rfx on abnormal to Free T4 4. Marginal insertion of umbilical cord affecting management of mother Normal growth and AFI, follow w/ MFM, u/s follow up 2/4    Preterm labor symptoms and general obstetric precautions including but not limited to vaginal bleeding, contractions, leaking of fluid and fetal movement were reviewed in detail with the patient. Please refer to After Visit Summary for other counseling recommendations.   Return in about 4 weeks (around 04/07/2023) for OB VISIT (MD or APP).  Future Appointments  Date Time Provider Department Center  04/06/2023  9:30 AM [facility]    Nidia Daring, FNP

## 2023-03-10 NOTE — Progress Notes (Signed)
 Pt presents for ROB visit. Pt c/o headaches, cold chills and lower abd pain

## 2023-03-11 LAB — TSH RFX ON ABNORMAL TO FREE T4: TSH: 1.04 u[IU]/mL (ref 0.450–4.500)

## 2023-04-06 ENCOUNTER — Ambulatory Visit: Payer: Medicaid Other | Attending: Obstetrics

## 2023-04-06 ENCOUNTER — Other Ambulatory Visit: Payer: Self-pay | Admitting: *Deleted

## 2023-04-06 ENCOUNTER — Other Ambulatory Visit: Payer: Self-pay

## 2023-04-06 DIAGNOSIS — O09522 Supervision of elderly multigravida, second trimester: Secondary | ICD-10-CM | POA: Insufficient documentation

## 2023-04-06 DIAGNOSIS — Z3A23 23 weeks gestation of pregnancy: Secondary | ICD-10-CM

## 2023-04-06 DIAGNOSIS — O99212 Obesity complicating pregnancy, second trimester: Secondary | ICD-10-CM

## 2023-04-06 DIAGNOSIS — E669 Obesity, unspecified: Secondary | ICD-10-CM

## 2023-04-06 DIAGNOSIS — O43199 Other malformation of placenta, unspecified trimester: Secondary | ICD-10-CM

## 2023-04-06 DIAGNOSIS — Z362 Encounter for other antenatal screening follow-up: Secondary | ICD-10-CM | POA: Diagnosis present

## 2023-04-06 DIAGNOSIS — O43192 Other malformation of placenta, second trimester: Secondary | ICD-10-CM

## 2023-04-07 ENCOUNTER — Encounter: Payer: Medicaid Other | Admitting: Obstetrics and Gynecology

## 2023-04-07 ENCOUNTER — Encounter: Payer: Self-pay | Admitting: Obstetrics and Gynecology

## 2023-04-07 ENCOUNTER — Ambulatory Visit (INDEPENDENT_AMBULATORY_CARE_PROVIDER_SITE_OTHER): Payer: Medicaid Other | Admitting: Obstetrics and Gynecology

## 2023-04-07 VITALS — BP 130/70 | HR 134 | Wt 188.6 lb

## 2023-04-07 DIAGNOSIS — Z3A24 24 weeks gestation of pregnancy: Secondary | ICD-10-CM | POA: Diagnosis not present

## 2023-04-07 DIAGNOSIS — O43199 Other malformation of placenta, unspecified trimester: Secondary | ICD-10-CM

## 2023-04-07 DIAGNOSIS — O09529 Supervision of elderly multigravida, unspecified trimester: Secondary | ICD-10-CM | POA: Diagnosis not present

## 2023-04-07 DIAGNOSIS — O26892 Other specified pregnancy related conditions, second trimester: Secondary | ICD-10-CM

## 2023-04-07 DIAGNOSIS — Z3480 Encounter for supervision of other normal pregnancy, unspecified trimester: Secondary | ICD-10-CM

## 2023-04-07 DIAGNOSIS — R12 Heartburn: Secondary | ICD-10-CM

## 2023-04-07 DIAGNOSIS — K117 Disturbances of salivary secretion: Secondary | ICD-10-CM

## 2023-04-07 MED ORDER — ONDANSETRON HCL 4 MG PO TABS: 4.0000 mg | ORAL_TABLET | Freq: Three times a day (TID) | ORAL | 3 refills | Status: DC | PRN

## 2023-04-07 MED ORDER — PANTOPRAZOLE SODIUM 20 MG PO TBEC: 20.0000 mg | DELAYED_RELEASE_TABLET | Freq: Every day | ORAL | 5 refills | Status: DC

## 2023-04-07 NOTE — Progress Notes (Signed)
   PRENATAL VISIT NOTE  Subjective:  April Boone is a 38 y.o. [id number] at [redacted]w[redacted]d being seen today for ongoing prenatal care.  She is currently monitored for the following issues for this low-risk pregnancy and has Supervision of other normal pregnancy, antepartum; AMA (advanced maternal age) multigravida 35+; Maternal obesity affecting pregnancy, antepartum; and Marginal insertion of umbilical cord affecting management of mother on their problem list.  Patient reports  nausea and heartburn started back last night,does not have anymore medication .  Contractions: Not present. Vag. Bleeding: None.  Movement: Present. Denies leaking of fluid.   The following portions of the patient's history were reviewed and updated as appropriate: allergies, current medications, past family history, past medical history, past social history, past surgical history and problem list.   Objective:   Vitals:   04/07/23 1006  BP: 130/70  Pulse: (!) 134  Weight: 188 lb 9.6 oz (85.5 kg)    Fetal Status: Fetal Heart Rate (bpm): 141   Movement: Present     General:  Alert, oriented and cooperative. Patient is in no acute distress.  Skin: Skin is warm and dry. No rash noted.   Cardiovascular: Normal heart rate noted  Respiratory: Normal respiratory effort, no problems with respiration noted  Abdomen: Soft, gravid, appropriate for gestational age.  Pain/Pressure: Absent     Pelvic: Cervical exam deferred        Extremities: Normal range of motion.  Edema: None  Mental Status: Normal mood and affect. Normal behavior. Normal judgment and thought content.   Assessment and Plan:  Pregnancy: [id number] at [redacted]w[redacted]d 1. Supervision of other normal pregnancy, antepartum (Primary) BP and FHR normal Doing well, feeling regular movement    2. Antepartum multigravida of advanced maternal age Following mfm Continue ASA  3. Marginal insertion of umbilical cord affecting management of mother 2/4 u/s afi normal, EFW 83%,  follow up 4/1  4. [redacted] weeks gestation of pregnancy Anticipatory guidance regarding GTT and labs next visit   5. Heartburn during pregnancy in second trimester Follow up precautions discussed  - pantoprazole  (PROTONIX ) 20 MG tablet; Take 1 tablet (20 mg total) by mouth daily.  Dispense: 30 tablet; Refill: 5 - ondansetron  (ZOFRAN ) 4 MG tablet; Take 1-2 tablets (4-8 mg total) by mouth every 8 (eight) hours as needed for nausea or vomiting.  Dispense: 60 tablet; Refill: 3   - ondansetron  (ZOFRAN ) 4 MG tablet; Take 1-2 tablets (4-8 mg total) by mouth every 8 (eight) hours as needed for nausea or vomiting.  Dispense: 60 tablet; Refill: 3   Preterm labor symptoms and general obstetric precautions including but not limited to vaginal bleeding, contractions, leaking of fluid and fetal movement were reviewed in detail with the patient. Please refer to After Visit Summary for other counseling recommendations.   Return in about 4 weeks (around 05/05/2023) for OB VISIT (MD or APP), 2 hr GTT.  Future Appointments  Date Time Provider Department Center  06/01/2023  8:30 AM [facility]    Nidia Daring, FNP

## 2023-04-07 NOTE — Progress Notes (Signed)
 Pt presents for ROB visit. Pt c/o nausea, acid reflux, chills and headaches.

## 2023-05-05 ENCOUNTER — Ambulatory Visit: Payer: Medicaid Other | Admitting: Obstetrics and Gynecology

## 2023-05-05 ENCOUNTER — Encounter (HOSPITAL_COMMUNITY): Payer: Self-pay | Admitting: Obstetrics and Gynecology

## 2023-05-05 ENCOUNTER — Inpatient Hospital Stay (HOSPITAL_COMMUNITY)
Admission: AD | Admit: 2023-05-05 | Discharge: 2023-05-05 | Disposition: A | Discharge status: Home / Self Care | Attending: Obstetrics and Gynecology | Admitting: Obstetrics and Gynecology

## 2023-05-05 ENCOUNTER — Other Ambulatory Visit: Payer: Medicaid Other

## 2023-05-05 DIAGNOSIS — K0889 Other specified disorders of teeth and supporting structures: Secondary | ICD-10-CM | POA: Diagnosis not present

## 2023-05-05 DIAGNOSIS — O99891 Other specified diseases and conditions complicating pregnancy: Secondary | ICD-10-CM | POA: Insufficient documentation

## 2023-05-05 DIAGNOSIS — Z3480 Encounter for supervision of other normal pregnancy, unspecified trimester: Secondary | ICD-10-CM

## 2023-05-05 DIAGNOSIS — R519 Headache, unspecified: Secondary | ICD-10-CM | POA: Diagnosis not present

## 2023-05-05 DIAGNOSIS — O26893 Other specified pregnancy related conditions, third trimester: Secondary | ICD-10-CM | POA: Diagnosis not present

## 2023-05-05 DIAGNOSIS — Z91199 Patient's noncompliance with other medical treatment and regimen due to unspecified reason: Secondary | ICD-10-CM

## 2023-05-05 DIAGNOSIS — Z3A28 28 weeks gestation of pregnancy: Secondary | ICD-10-CM | POA: Insufficient documentation

## 2023-05-05 MED ORDER — AMOXICILLIN 500 MG PO CAPS: 500.0000 mg | ORAL_CAPSULE | Freq: Three times a day (TID) | ORAL | 0 refills | Status: AC

## 2023-05-05 MED ORDER — OXYCODONE HCL 5 MG PO TABS: 5.0000 mg | ORAL_TABLET | ORAL | 0 refills | Status: DC | PRN

## 2023-05-05 MED ORDER — AMOXICILLIN 500 MG PO CAPS
500.0000 mg | ORAL_CAPSULE | Freq: Once | ORAL | Status: AC
  Administered 2023-05-05: 500 mg via ORAL
  Filled 2023-05-05: qty 1

## 2023-05-05 MED ORDER — OXYCODONE HCL 5 MG PO TABS
10.0000 mg | ORAL_TABLET | Freq: Once | ORAL | Status: AC
  Administered 2023-05-05: 10 mg via ORAL
  Filled 2023-05-05: qty 2

## 2023-05-05 NOTE — MAU Provider Note (Signed)
 Chief Complaint:  Facial Pain and Dental Pain   Event Date/Time   First Provider Initiated Contact with Patient 05/05/23 0124    HPI: April Boone is a 38 y.o. [id number] at 53w0dwho presents to maternity admissions reporting pain in right upper jaw/cheek since 8pm.  States all her upper teeth hurt.  Tylenol did not help  Has spoken with a dentist who requires a letter.  Has a Glucola visit in office today (Wednesday).. She reports good fetal movement, denies LOF, vaginal bleeding, or fever/chills.    Dental Pain  This is a new problem. The current episode started today. The problem occurs constantly. Associated symptoms include facial pain. Pertinent negatives include no difficulty swallowing, fever, oral bleeding or sinus pressure. She has tried acetaminophen for the symptoms. The treatment provided no relief.    RN Note: Aruna Nestler is a 38 y.o. at [redacted]w[redacted]d here in MAU reporting pain in teeth on R side of face since 2000. Has taken Tylenol and not helping. All teeth on top and bottom on R side hurts. Reports good FM and no VB or LOF.   LMP: n/a Onset of complaint: 2000                       Pain score: 10  Past Medical History: Past Medical History:  Diagnosis Date   Medical history non-contributory    Obese     Past obstetric history: OB History  Gravida Para Term Preterm AB Living  5 3 3  1 4   SAB IAB Ectopic Multiple Live Births  1   1 4     # Outcome Date GA Lbr Len/2nd Weight Sex Type Anes PTL Lv  5 Current           4 SAB 06/2020          3 Term 04/2012     Vag-Spont   LIV  2 Term 03/02/09 104w0d   M Vag-Spont None  LIV  1A Term 02/14/07     Vag-Spont   LIV  1B Term 02/14/07     Vag-Spont  N LIV    Past Surgical History: Past Surgical History:  Procedure Laterality Date   NO PAST SURGERIES      Family History: Family History  Problem Relation Age of Onset   Healthy Mother    Healthy Father     Social History: Social History   Tobacco Use   Smoking  status: Never   Smokeless tobacco: Never  Vaping Use   Vaping status: Never Used  Substance Use Topics   Alcohol use: Not Currently   Drug use: Not Currently    Allergies: No Known Allergies  Meds:  Medications Prior to Admission  Medication Sig Dispense Refill Last Dose/Taking   acetaminophen (TYLENOL) 500 MG tablet Take 1-2 tablets (500-1,000 mg total) by mouth every 6 (six) hours as needed for headache. 100 tablet 0    aspirin EC 81 MG tablet Take 1 tablet (81 mg total) by mouth daily. Take after 12 weeks for prevention of preeclampsia later in pregnancy 300 tablet 2    doxylamine, Sleep, (UNISOM) 25 MG tablet Take 0.5 tablets (12.5 mg total) by mouth at bedtime as needed (nausea/vomiting). (Patient not taking: Reported on 04/07/2023) 30 tablet 0    ondansetron (ZOFRAN) 4 MG tablet Take 1-2 tablets (4-8 mg total) by mouth every 8 (eight) hours as needed for nausea or vomiting. 60 tablet 3    pantoprazole (PROTONIX) 20 MG tablet  Take 1 tablet (20 mg total) by mouth daily. 30 tablet 5    Prenatal Vit-Fe Fumarate-FA (PRENATAL VITAMINS) 28-0.8 MG TABS Take 1 tablet by mouth daily. 30 tablet 12    promethazine (PHENERGAN) 25 MG tablet Take 1 tablet (25 mg total) by mouth every 6 (six) hours as needed for nausea or vomiting. (Patient not taking: Reported on 04/07/2023) 30 tablet 3    pyridOXINE (VITAMIN B6) 25 MG tablet Take 1 tablet (25 mg total) by mouth 3 (three) times daily as needed. (Patient not taking: Reported on 04/07/2023) 30 tablet 0    scopolamine (TRANSDERM-SCOP) 1 MG/3DAYS Place 1 patch (1.5 mg total) onto the skin every 3 (three) days. (Patient not taking: Reported on 01/07/2023) 10 patch 3     I have reviewed patient's Past Medical Hx, Surgical Hx, Family Hx, Social Hx, medications and allergies.   ROS:  Review of Systems  Constitutional:  Negative for fever.  HENT:  Negative for sinus pressure.    Other systems negative  Physical Exam  Patient Vitals for the past 24 hrs:   BP Temp Pulse Resp SpO2 Height Weight  05/05/23 0049 124/80 97.8 F (36.6 C) 97 17 100 % 5\' 2"  (1.575 m) 85.7 kg   Constitutional: Well-developed, well-nourished female in no acute distress.  Mouth:  No obvious abscess seen.  Black spot on tooth #1 Cardiovascular: normal rate  Respiratory: normal effort GI: Abd soft, non-tender, gravid appropriate for gestational age.   MS: Extremities nontender, no edema, normal ROM Neurologic: Alert and oriented x 4.   PELVIC EXAM: deferred   FHT:  Baseline 130 , moderate variability, accelerations present, no decelerations Contractions:  Rare   Labs: No results found for this or any previous visit (from the past 24 hours). B/Positive/-- (11/07 1038)  Imaging:    MAU Course/MDM: I have reviewed the triage vital signs and the nursing notes.   Pertinent labs & imaging results that were available during my care of the patient were reviewed by me and considered in my medical decision making (see chart for details).      I have reviewed her medical records including past results, notes and treatments.   NST reviewed   Treatments in MAU included Percocet and Amoxicillin Did get pain relief from meds Discussed we recommend ongoing antibiotic until she can be seen by dentist Dental permission letter provided to patient. .    Assessment: Single IUP at [redacted]w[redacted]d Right dental pain  Plan: Discharge home Rx Amoxicillin for tooth pain Rx Percocet for pain Follow up in Office for prenatal visits and recheck Encouraged to return if she develops worsening of symptoms, increase in pain, fever, or other concerning symptoms.   Pt stable at time of discharge.  Wynelle Bourgeois CNM, MSN Certified Nurse-Midwife 05/05/2023 1:24 AM

## 2023-05-05 NOTE — Progress Notes (Deleted)
   PRENATAL VISIT NOTE  Subjective:  April Boone is a 38 y.o. [id number] at [redacted]w[redacted]d being seen today for ongoing prenatal care.  She is currently monitored for the following issues for this low-risk pregnancy and has Supervision of other normal pregnancy, antepartum; AMA (advanced maternal age) multigravida 35+; Maternal obesity affecting pregnancy, antepartum; and Marginal insertion of umbilical cord affecting management of mother on their problem list.  Patient reports no complaints.   .  .   . Denies leaking of fluid.   The following portions of the patient's history were reviewed and updated as appropriate: allergies, current medications, past family history, past medical history, past social history, past surgical history and problem list.   Objective:  There were no vitals filed for this visit.  Fetal Status:           General:  Alert, oriented and cooperative. Patient is in no acute distress.  Skin: Skin is warm and dry. No rash noted.   Cardiovascular: Normal heart rate noted  Respiratory: Normal respiratory effort, no problems with respiration noted  Abdomen: Soft, gravid, appropriate for gestational age.        Pelvic: Cervical exam deferred        Extremities: Normal range of motion.     Mental Status: Normal mood and affect. Normal behavior. Normal judgment and thought content.   Assessment and Plan:  Pregnancy: [id number] at [redacted]w[redacted]d 1. Supervision of other normal pregnancy, antepartum (Primary)   2. Antepartum multigravida of advanced maternal age Following MFM  3. Marginal insertion of umbilical cord affecting management of mother Follow up u/s 4/1  4. [redacted] weeks gestation of pregnancy GTT and labs   Preterm labor symptoms and general obstetric precautions including but not limited to vaginal bleeding, contractions, leaking of fluid and fetal movement were reviewed in detail with the patient. Please refer to After Visit Summary for other counseling recommendations.    No follow-ups on file.  Future Appointments  Date Time Provider Department Center  05/05/2023  8:55 AM Sue Lush, FNP [facility] None  06/01/2023  8:30 AM [facility]    Albertine Grates, FNP

## 2023-05-05 NOTE — MAU Note (Signed)
.  April Boone is a 38 y.o. at [redacted]w[redacted]d here in MAU reporting pain in teeth on R side of face since 2000. Has taken Tylenol and not helping. All teeth on top and bottom on R side hurts. Reports good FM and no VB or LOF.   LMP: n/a Onset of complaint: 2000 Pain score: 10 Vitals:   05/05/23 0049  BP: 124/80  Pulse: 97  Resp: 17  Temp: 97.8 F (36.6 C)  SpO2: 100%     FHT: 144  Lab orders placed from triage: none

## 2023-05-05 NOTE — MAU Note (Signed)
 Pt says right upper tooth hurts - started yesterday . She has an appointment today with OB Dr at 810am- to get an note for dentist .    Took Tyl at 9pm - no relief.

## 2023-05-07 ENCOUNTER — Encounter: Payer: Self-pay | Admitting: Obstetrics and Gynecology

## 2023-05-07 ENCOUNTER — Ambulatory Visit: Admitting: Obstetrics and Gynecology

## 2023-05-07 ENCOUNTER — Other Ambulatory Visit

## 2023-05-07 VITALS — BP 115/71 | HR 127 | Wt 187.6 lb

## 2023-05-07 DIAGNOSIS — Z3A28 28 weeks gestation of pregnancy: Secondary | ICD-10-CM

## 2023-05-07 DIAGNOSIS — O43199 Other malformation of placenta, unspecified trimester: Secondary | ICD-10-CM

## 2023-05-07 DIAGNOSIS — O09529 Supervision of elderly multigravida, unspecified trimester: Secondary | ICD-10-CM | POA: Diagnosis not present

## 2023-05-07 DIAGNOSIS — Z3480 Encounter for supervision of other normal pregnancy, unspecified trimester: Secondary | ICD-10-CM

## 2023-05-07 DIAGNOSIS — O2200 Varicose veins of lower extremity in pregnancy, unspecified trimester: Secondary | ICD-10-CM

## 2023-05-07 NOTE — Progress Notes (Signed)
 Pt presents for ROB visit. Pt c/o pain in the legs from varicose veins and fatigue. Want to defer Tdap.

## 2023-05-07 NOTE — Progress Notes (Signed)
   PRENATAL VISIT NOTE  Subjective:  April Boone is a 38 y.o. [id number] at [redacted]w[redacted]d being seen today for ongoing prenatal care.  She is currently monitored for the following issues for this low-risk pregnancy and has Supervision of other normal pregnancy, antepartum; AMA (advanced maternal age) multigravida 35+; Maternal obesity affecting pregnancy, antepartum; and Marginal insertion of umbilical cord affecting management of mother on their problem list.  Patient reports  varicose veins bilateral. Has them prior to pregnanc, but reports they get worse during pregnancy. She reports intermittene pain.  .  Contractions: Not present. Vag. Bleeding: None.  Movement: Present. Denies leaking of fluid.   The following portions of the patient's history were reviewed and updated as appropriate: allergies, current medications, past family history, past medical history, past social history, past surgical history and problem list.   Objective:   Vitals:   05/07/23 0851  BP: 115/71  Pulse: (!) 127  Weight: 187 lb 9.6 oz (85.1 kg)    Fetal Status: Fetal Heart Rate (bpm): 140   Movement: Present     General:  Alert, oriented and cooperative. Patient is in no acute distress.  Skin: Skin is warm and dry. No rash noted.   Cardiovascular: Normal heart rate noted  Respiratory: Normal respiratory effort, no problems with respiration noted  Abdomen: Soft, gravid, appropriate for gestational age.  Pain/Pressure: Absent     Pelvic: Cervical exam deferred        Extremities: Normal range of motion.  Edema: None  Mental Status: Normal mood and affect. Normal behavior. Normal judgment and thought content.   Assessment and Plan:  Pregnancy: [id number] at [redacted]w[redacted]d 1. Supervision of other normal pregnancy, antepartum (Primary) BP and FHR normal Doing well, feeling regular movement    2. Antepartum multigravida of advanced maternal age Following MFM Continue ASA LR NIPT  3. Marginal insertion of umbilical cord  affecting management of mother Follow up 4/1  2/4 u/s normal afi, EFW 83 %  4. [redacted] weeks gestation of pregnancy GTT and labs today  Defers Tdap today  List given for pediatrician  5. Varicose veins during pregnancy Discussed supportive measures, compression socks, knee high vs thigh high, encouraged to wear daily     Preterm labor symptoms and general obstetric precautions including but not limited to vaginal bleeding, contractions, leaking of fluid and fetal movement were reviewed in detail with the patient. Please refer to After Visit Summary for other counseling recommendations.   Return in about 2 weeks (around 05/21/2023) for OB VISIT (MD or APP).  Future Appointments  Date Time Provider Department Center  05/07/2023 10:15 AM Sue Lush, FNP [facility] None  06/01/2023  8:30 AM [facility]    Albertine Grates, FNP

## 2023-05-07 NOTE — Patient Instructions (Signed)
 [facility] Pediatric Providers  [place] ([id number])  [facility]) - [facility], MD; Manson Passey, MD; Ave Filter, MD; Luna Fuse, MD; Kennedy Bucker, MD; Florestine Avers, MD; Melchor Amour, MD; Yetta Barre,  MD; Konrad Dolores, MD; Kathlene November, MD; Jenne Campus, MD; Wynetta Emery, MD; Duffy Rhody, MD; Gerre Couch, NP [address] [place], [place], [place] [address] [phone] Mon, Tue, Thur, Fri 8:30-5:30, Wed 9:30-5:30, Sat 8:30-12:30 Only accepting infants of first-time parents or siblings of current patients Hospital discharge coordinator will make follow-up appointment Medicaid - yes; Tricare - yes   [facility] ([facility] - Pediatrics at [place]e Radon, MD; Sabino Dick, MD; Quitman Livings, MD; Betha Loa, NP; Claretha Cooper, MD; Lelon Perla, MD [address] [place], [place] [address] [phone] Mon-Fri 8:30-5:30 Medicaid - yes, Tricare - yes  [place] [phone]) [facility], MD [address], [place], [place] [address] [phone] Mon, Tues, Wed Fri 8:30-5:00, Sat 8:30-12:00, Closed Thursdays Accepting siblings of established patients and first time mom's if you call prenatally Medicaid- yes; Tricare - yes   [facility] [address], [place], [place] [address] [phone] Mon-Fri 8:30-5:00 (lunch 12:00-1:00) Medicaid -Yes; Tricare - Yes   [facility] [place], MD; [place], [place]; [place], [place]; [place], [place] [address] [place] [address] [phone] Mon-Fri 7:30-5:30 Medicaid - Yes; Sherolyn Buba  [place] [phone] & [phone])  [facility], MD [address] [place], [place] [address] [phone] Mon-Thur 8:00-6:00, closed for lunch 12-2, closed Fridays Medicaid - yes; Tricare - no  [facility] Dareen Piano, NP; Cyndia Bent, MD; [place], [place]; [place], [place] [address], [place],  [place] [address] [phone] Mon-Fri 7:30-4:30 Medicaid - yes, Tricare - yes  [facility]  Juanito Doom, MD; Janene Harvey, NP; Vonita Moss, MD; Donn Pierini, NP [address], [place], [place] [address] [phone] Mon-Fri 8:30-5:00, closed for lunch 1-2, Sat 8:30-12:00 - sick visits only Providers come to see babies at [facility] Only accepting newborns of siblings and first time parents ONLY if who have met with office prior to delivery Medicaid -Yes; Tricare - yes  [facility] - [place], [place]; Spero Geralds, NP; Earlene Plater, MD; Lucretia Roers, MD:  [address], [place], [place] [address] [phone] Mon- Fri 8:00-5:00, Sat 9:00-12:00 - sick visits only Accepting siblings of established patients and first time mom/baby Medicaid - Yes; Tricare - yes Patients must have vaccinations (baby vaccines)   [employer] [phone])  [facility] Alfredo Bach, PA; [place], [place]; Eddie Candle, MD; Normand Sloop, MD; [place], NP; [place], DO; [place], [place]; Constance Goltz, MD; Ruthann Cancer, MD; Vear Clock, MD; [place], [place]; [place], [place]; [place], [place] [address] [facility] [name], [place], [place] [address] [phone] Mon-Fri 8:30-5:00, Sat 9:00-12:00 - sick only Please register online [email or web address] then schedule online or call office Medicaid-Yes; Tricare -yes   [facility] at [place] ([facility] [place]) [place], [place]; List, FNP; Berneda Rose, MD; Luther Redo, PA-C; Lavonia Drafts, MD; Kellie Simmering, FNP; Genevie Cheshire, FNP; Evaristo Bury, MD; Berneda Rose, MD [phone] [address] [place], [place] [address] [phone] Mon-Fri 8:30-5:30 Medicaid - Yes; Tricare - yes  [facility] - [address]  [place], [place]; Whitney Post, MD; Hennie Duos, MD; Wynne Dust, MD; [place], NP [address], [place], [place] [address] [phone] Mon-Thur 8:00-5:30, Fri 8:00-5:00, Sat 9:00-12:00 Medicaid - yes, Tricare - yes

## 2023-05-08 ENCOUNTER — Encounter: Payer: Self-pay | Admitting: Obstetrics and Gynecology

## 2023-05-08 ENCOUNTER — Other Ambulatory Visit: Payer: Self-pay | Admitting: Obstetrics and Gynecology

## 2023-05-08 DIAGNOSIS — O24419 Gestational diabetes mellitus in pregnancy, unspecified control: Secondary | ICD-10-CM | POA: Insufficient documentation

## 2023-05-08 LAB — CBC
Hematocrit: 30.5 % — ABNORMAL LOW (ref 34.0–46.6)
Hemoglobin: 10.1 g/dL — ABNORMAL LOW (ref 11.1–15.9)
MCH: 28.4 pg (ref 26.6–33.0)
MCHC: 33.1 g/dL (ref 31.5–35.7)
MCV: 86 fL (ref 79–97)
Platelets: 196 10*3/uL (ref 150–450)
RBC: 3.56 x10E6/uL — ABNORMAL LOW (ref 3.77–5.28)
RDW: 12 % (ref 11.7–15.4)
WBC: 9.2 10*3/uL (ref 3.4–10.8)

## 2023-05-08 LAB — GLUCOSE TOLERANCE, 2 HOURS W/ 1HR
Glucose, 1 hour: 226 mg/dL — ABNORMAL HIGH (ref 70–179)
Glucose, 2 hour: 154 mg/dL — ABNORMAL HIGH (ref 70–152)
Glucose, Fasting: 114 mg/dL — ABNORMAL HIGH (ref 70–91)

## 2023-05-08 LAB — HIV ANTIBODY (ROUTINE TESTING W REFLEX): HIV Screen 4th Generation wRfx: NONREACTIVE

## 2023-05-08 LAB — RPR: RPR Ser Ql: NONREACTIVE

## 2023-05-08 MED ORDER — FERROUS SULFATE 325 (65 FE) MG PO TABS: 325.0000 mg | ORAL_TABLET | Freq: Every day | ORAL | 1 refills | Status: DC

## 2023-05-08 NOTE — Progress Notes (Signed)
 No show for appt.

## 2023-05-10 ENCOUNTER — Other Ambulatory Visit: Payer: Self-pay | Admitting: *Deleted

## 2023-05-10 DIAGNOSIS — O24410 Gestational diabetes mellitus in pregnancy, diet controlled: Secondary | ICD-10-CM

## 2023-05-10 MED ORDER — ACCU-CHEK SOFTCLIX LANCETS MISC: 1.0000 | Freq: Four times a day (QID) | 12 refills | Status: DC

## 2023-05-10 MED ORDER — ACCU-CHEK GUIDE TEST VI STRP: ORAL_STRIP | 12 refills | Status: DC

## 2023-05-10 MED ORDER — ACCU-CHEK GUIDE W/DEVICE KIT: 1.0000 | PACK | Freq: Four times a day (QID) | 0 refills | Status: DC

## 2023-05-10 NOTE — Progress Notes (Signed)
 TC to patient to advise of GDM, supplies sent, and referral to Diabetes Educators. Pt was unable to access MyChart message from provider. Password reset with pts permission. Pt verbalized understanding of instructions.

## 2023-05-17 ENCOUNTER — Inpatient Hospital Stay (HOSPITAL_COMMUNITY)
Admission: AD | Admit: 2023-05-17 | Discharge: 2023-05-17 | Disposition: A | Discharge status: Home / Self Care | Payer: Self-pay | Attending: Obstetrics & Gynecology | Admitting: Obstetrics & Gynecology

## 2023-05-17 ENCOUNTER — Encounter (HOSPITAL_COMMUNITY): Payer: Self-pay | Admitting: Obstetrics & Gynecology

## 2023-05-17 DIAGNOSIS — N760 Acute vaginitis: Secondary | ICD-10-CM

## 2023-05-17 DIAGNOSIS — Z3689 Encounter for other specified antenatal screening: Secondary | ICD-10-CM | POA: Insufficient documentation

## 2023-05-17 DIAGNOSIS — O09523 Supervision of elderly multigravida, third trimester: Secondary | ICD-10-CM | POA: Diagnosis not present

## 2023-05-17 DIAGNOSIS — R109 Unspecified abdominal pain: Secondary | ICD-10-CM | POA: Diagnosis present

## 2023-05-17 DIAGNOSIS — O24410 Gestational diabetes mellitus in pregnancy, diet controlled: Secondary | ICD-10-CM | POA: Diagnosis not present

## 2023-05-17 DIAGNOSIS — B9689 Other specified bacterial agents as the cause of diseases classified elsewhere: Secondary | ICD-10-CM | POA: Diagnosis not present

## 2023-05-17 DIAGNOSIS — O2343 Unspecified infection of urinary tract in pregnancy, third trimester: Secondary | ICD-10-CM | POA: Insufficient documentation

## 2023-05-17 DIAGNOSIS — O23593 Infection of other part of genital tract in pregnancy, third trimester: Secondary | ICD-10-CM | POA: Insufficient documentation

## 2023-05-17 DIAGNOSIS — Z3A29 29 weeks gestation of pregnancy: Secondary | ICD-10-CM | POA: Diagnosis not present

## 2023-05-17 DIAGNOSIS — O99213 Obesity complicating pregnancy, third trimester: Secondary | ICD-10-CM | POA: Diagnosis not present

## 2023-05-17 DIAGNOSIS — N390 Urinary tract infection, site not specified: Secondary | ICD-10-CM | POA: Insufficient documentation

## 2023-05-17 DIAGNOSIS — O43123 Velamentous insertion of umbilical cord, third trimester: Secondary | ICD-10-CM | POA: Insufficient documentation

## 2023-05-17 HISTORY — DX: Gestational diabetes mellitus in pregnancy, unspecified control: O24.419

## 2023-05-17 LAB — GC/CHLAMYDIA PROBE AMP (~~LOC~~) NOT AT ARMC
Chlamydia: NEGATIVE
Comment: NEGATIVE
Comment: NORMAL
Neisseria Gonorrhea: NEGATIVE

## 2023-05-17 LAB — WET PREP, GENITAL
Sperm: NONE SEEN
Trich, Wet Prep: NONE SEEN
WBC, Wet Prep HPF POC: >=10 — AB (ref <10)
Yeast Wet Prep HPF POC: NONE SEEN

## 2023-05-17 LAB — URINALYSIS, ROUTINE W REFLEX MICROSCOPIC
Bilirubin Urine: NEGATIVE
Glucose, UA: NEGATIVE mg/dL
Hgb urine dipstick: NEGATIVE
Ketones, ur: NEGATIVE mg/dL
Nitrite: NEGATIVE
Protein, ur: NEGATIVE mg/dL
Specific Gravity, Urine: 1.004 — ABNORMAL LOW (ref 1.005–1.030)
pH: 7 (ref 5.0–8.0)

## 2023-05-17 LAB — OB RESULTS CONSOLE GBS: GBS: POSITIVE

## 2023-05-17 LAB — FETAL FIBRONECTIN: Fetal Fibronectin: NEGATIVE

## 2023-05-17 MED ORDER — ACETAMINOPHEN 500 MG PO TABS
1000.0000 mg | ORAL_TABLET | Freq: Once | ORAL | Status: AC
  Administered 2023-05-17: 1000 mg via ORAL
  Filled 2023-05-17: qty 2

## 2023-05-17 MED ORDER — NIFEDIPINE 10 MG PO CAPS
10.0000 mg | ORAL_CAPSULE | ORAL | Status: DC | PRN
  Administered 2023-05-17 (×2): 10 mg via ORAL
  Filled 2023-05-17 (×2): qty 1

## 2023-05-17 MED ORDER — CEFADROXIL 500 MG PO CAPS: 500.0000 mg | ORAL_CAPSULE | Freq: Two times a day (BID) | ORAL | 0 refills | Status: DC

## 2023-05-17 MED ORDER — FLUCONAZOLE 150 MG PO TABS
150.0000 mg | ORAL_TABLET | Freq: Once | ORAL | Status: AC
  Administered 2023-05-17: 150 mg via ORAL
  Filled 2023-05-17: qty 1

## 2023-05-17 MED ORDER — CYCLOBENZAPRINE HCL 5 MG PO TABS
10.0000 mg | ORAL_TABLET | Freq: Once | ORAL | Status: AC
  Administered 2023-05-17: 10 mg via ORAL
  Filled 2023-05-17: qty 2

## 2023-05-17 MED ORDER — SODIUM CHLORIDE 0.9 % IV BOLUS
1000.0000 mL | Freq: Once | INTRAVENOUS | Status: AC
  Administered 2023-05-17: 1000 mL via INTRAVENOUS

## 2023-05-17 MED ORDER — NIFEDIPINE 10 MG PO CAPS: 10.0000 mg | ORAL_CAPSULE | ORAL | 0 refills | Status: DC | PRN

## 2023-05-17 MED ORDER — METRONIDAZOLE 500 MG PO TABS
500.0000 mg | ORAL_TABLET | Freq: Once | ORAL | Status: AC
  Administered 2023-05-17: 500 mg via ORAL
  Filled 2023-05-17: qty 1

## 2023-05-17 MED ORDER — SODIUM CHLORIDE 0.9 % IV SOLN
2.0000 g | Freq: Once | INTRAVENOUS | Status: AC
  Administered 2023-05-17: 2 g via INTRAVENOUS
  Filled 2023-05-17: qty 20

## 2023-05-17 MED ORDER — METRONIDAZOLE 500 MG PO TABS: 500.0000 mg | ORAL_TABLET | Freq: Once | ORAL | 0 refills | Status: AC

## 2023-05-17 NOTE — MAU Provider Note (Signed)
 MAU Provider Note  Chief Complaint: Abdominal Pain  SUBJECTIVE HPI: April Boone is a 38 y.o. [id number] at [redacted]w[redacted]d by early ultrasound who presents to maternity admissions reporting abdominal pain. Pregnancy c/b AMA, obesity, marginal cord insertion, newly diagnosed GDM. Receives [facility] with [facility].  Patient presents with complaint of severe abdominal pain.  States pain is coming and going like a contraction feels like a contraction but mostly on the left side of her uterus.  Started around 5 PM and was able to sleep for a little bit.  Then has not been able to sleep since about 11 PM as the pain has gotten significantly worse.  Patient notes dysuria.  Denies fever/chills, change in vaginal discharge, vaginal bleeding, leaking of fluid.  She does not have a history of preterm labor with prior pregnancies.  She was recently diagnosed with gestational diabetes and has appointment set up this week to learn about management.  HPI  Past Medical History:  Diagnosis Date   Gestational diabetes    Obese    Past Surgical History:  Procedure Laterality Date   NO PAST SURGERIES     Social History   Socioeconomic History   Marital status: Single    Spouse name: Not on file   Number of children: Not on file   Years of education: Not on file   Highest education level: Not on file  Occupational History   Not on file  Tobacco Use   Smoking status: Never   Smokeless tobacco: Never  Vaping Use   Vaping status: Never Used  Substance and Sexual Activity   Alcohol use: Not Currently   Drug use: Not Currently   Sexual activity: Not Currently    Birth control/protection: None  Other Topics Concern   Not on file  Social History Narrative   Not on file   Social Drivers of Health   Financial Resource Strain: Not on file  Food Insecurity: Not on file  Transportation Needs: Not on file  Physical Activity: Not on file  Stress: Not on file  Social Connections: Not on file  Intimate Partner  Violence: Not on file   No current facility-administered medications on file prior to encounter.   Current Outpatient Medications on File Prior to Encounter  Medication Sig Dispense Refill   aspirin EC 81 MG tablet Take 1 tablet (81 mg total) by mouth daily. Take after 12 weeks for prevention of preeclampsia later in pregnancy 300 tablet 2   acetaminophen (TYLENOL) 500 MG tablet Take 1-2 tablets (500-1,000 mg total) by mouth every 6 (six) hours as needed for headache. 100 tablet 0   doxylamine, Sleep, (UNISOM) 25 MG tablet Take 0.5 tablets (12.5 mg total) by mouth at bedtime as needed (nausea/vomiting). (Patient not taking: Reported on 03/10/2023) 30 tablet 0   ondansetron (ZOFRAN) 4 MG tablet Take 1-2 tablets (4-8 mg total) by mouth every 8 (eight) hours as needed for nausea or vomiting. 60 tablet 3   oxyCODONE (OXY IR/ROXICODONE) 5 MG immediate release tablet Take 1 tablet (5 mg total) by mouth every 4 (four) hours as needed for severe pain (pain score 7-10). 10 tablet 0   pantoprazole (PROTONIX) 20 MG tablet Take 1 tablet (20 mg total) by mouth daily. 30 tablet 5   Prenatal Vit-Fe Fumarate-FA (PRENATAL VITAMINS) 28-0.8 MG TABS Take 1 tablet by mouth daily. 30 tablet 12   promethazine (PHENERGAN) 25 MG tablet Take 1 tablet (25 mg total) by mouth every 6 (six) hours as needed for nausea or  vomiting. (Patient not taking: Reported on 03/10/2023) 30 tablet 3   pyridOXINE (VITAMIN B6) 25 MG tablet Take 1 tablet (25 mg total) by mouth 3 (three) times daily as needed. (Patient not taking: Reported on 03/10/2023) 30 tablet 0   scopolamine (TRANSDERM-SCOP) 1 MG/3DAYS Place 1 patch (1.5 mg total) onto the skin every 3 (three) days. (Patient not taking: Reported on 05/07/2023) 10 patch 3   No Known Allergies  ROS:  Pertinent positives/negatives listed above.  I have reviewed patient's Past Medical Hx, Surgical Hx, Family Hx, Social Hx, medications and allergies.   Physical Exam  Patient Vitals for the past  24 hrs:  BP Temp Temp src Pulse Resp SpO2 Height Weight  05/17/23 0533 109/63 -- -- (!) 109 -- -- -- --  05/17/23 0446 103/66 98.3 F (36.8 C) Oral (!) 112 17 100 % 5\' 2"  (1.575 m) 80.7 kg   Constitutional: Well-developed, well-nourished female. Appears in pain Cardiovascular: mild tachycardia Respiratory: normal effort GI: Abd soft, non-tender, gravid. Able to palpate some contractions MS: Extremities nontender, no edema, normal ROM. Varicosities present b/l legs, up to groin Neurologic: Alert and oriented x 4  GU: Neg CVAT  PELVIC EXAM: Cervix pink, visually closed, without lesion, vaginal walls and external genitalia normal. Clumps of white discharge  FHT:  Baseline 140, moderate variability, 10x10 accelerations present, shallow variables Contractions: q 5 mins  LAB RESULTS Results for orders placed or performed during the hospital encounter of 05/17/23 (from the past 24 hours)  Wet prep, genital     Status: Abnormal   Collection Time: 05/17/23  5:18 AM  Result Value Ref Range   Yeast Wet Prep HPF POC NONE SEEN NONE SEEN   Trich, Wet Prep NONE SEEN NONE SEEN   Clue Cells Wet Prep HPF POC PRESENT (A) NONE SEEN   WBC, Wet Prep HPF POC >=10 (A) <10   Sperm NONE SEEN   Fetal fibronectin     Status: None   Collection Time: 05/17/23  5:18 AM  Result Value Ref Range   Fetal Fibronectin NEGATIVE NEGATIVE  Urinalysis, Routine w reflex microscopic -Urine, Clean Catch     Status: Abnormal   Collection Time: 05/17/23  5:39 AM  Result Value Ref Range   Color, Urine YELLOW YELLOW   APPearance HAZY (A) CLEAR   Specific Gravity, Urine 1.004 (L) 1.005 - 1.030   pH 7.0 5.0 - 8.0   Glucose, UA NEGATIVE NEGATIVE mg/dL   Hgb urine dipstick NEGATIVE NEGATIVE   Bilirubin Urine NEGATIVE NEGATIVE   Ketones, ur NEGATIVE NEGATIVE mg/dL   Protein, ur NEGATIVE NEGATIVE mg/dL   Nitrite NEGATIVE NEGATIVE   Leukocytes,Ua MODERATE (A) NEGATIVE   RBC / HPF 0-5 0 - 5 RBC/hpf   WBC, UA 11-20 0 - 5  WBC/hpf   Bacteria, UA MANY (A) NONE SEEN   Squamous Epithelial / HPF 6-10 0 - 5 /HPF    B/Positive/-- (11/07 1038)  IMAGING No results found.  MAU Management/MDM: Orders Placed This Encounter  Procedures   Wet prep, genital   Culture, OB Urine   Fetal fibronectin   Urinalysis, Routine w reflex microscopic -Urine, Clean Catch   Discharge patient    Meds ordered this encounter  Medications   cyclobenzaprine (FLEXERIL) tablet 10 mg   acetaminophen (TYLENOL) tablet 1,000 mg   NIFEdipine (PROCARDIA) capsule 10 mg   cefTRIAXone (ROCEPHIN) 2 g in sodium chloride 0.9 % 100 mL IVPB    Antibiotic Indication::   UTI   fluconazole (  DIFLUCAN) tablet 150 mg   sodium chloride 0.9 % bolus 1,000 mL   metroNIDAZOLE (FLAGYL) tablet 500 mg   metroNIDAZOLE (FLAGYL) 500 MG tablet    Sig: Take 1 tablet (500 mg total) by mouth once for 1 dose.    Dispense:  1 tablet    Refill:  0   NIFEdipine (PROCARDIA) 10 MG capsule    Sig: Take 1 capsule (10 mg total) by mouth every 4 (four) hours as needed (contractions).    Dispense:  30 capsule    Refill:  0   cefadroxil (DURICEF) 500 MG capsule    Sig: Take 1 capsule (500 mg total) by mouth 2 (two) times daily.    Dispense:  12 capsule    Refill:  0     Available prenatal records reviewed.  Patient presents with abdominal pain that feels like contractions especially on her left at 29 weeks 5 days.  Patient was quickly assessed at the bedside given concern for possible preterm labor.  Reassuringly cervix is visually closed on sterile spec exam, and there is no fluid or bleeding present.  Does have contractions picking up on toco approximately every 5 minutes.  Do have concern for UTI, vaginitis, dehydration that could be causing these preterm contractions.  Will send wet prep, GC/C, FFN, UA for further evaluation.  Also obtain CBG given new diagnosis of GDM.  Will treat with Tylenol, Flexeril, Procardia series.  1914: UA returned indicative of UTI.   Patient does not meet septic criteria at this time nor does she show signs/symptoms of pyelonephritis (with exception of mild tachycardia).  Will treat with ceftriaxone 2 g IV x 1 dose to start antibiotic course.  Additionally given discharge on SSE and clue cells on wet prep, will treat for yeast with fluconazole x1 and start metronidazole course.  7829: FFN negative, again reassuringly pointing against preterm labor.  5621: Reassessed patient at the bedside. she is comfortably sleeping.  States the abdominal pain has significantly gone down and was able to doze off.  Discussed that she has a UTI and bacterial vaginosis which are causing preterm contractions but not preterm labor.  Will send home with Duricef and metronidazole for treatment.  Will additionally send home with Procardia as needed for contraction pain as this has been helpful to her while in MAU.  FWB: reactive NST for gestation and +FM.  ASSESSMENT 1. Urinary tract infection in mother during third trimester of pregnancy   2. Bacterial vaginosis   3. NST (non-stress test) reactive   4. [redacted] weeks gestation of pregnancy     PLAN Discharge home with strict return precautions. Allergies as of 05/17/2023   No Known Allergies      Medication List     STOP taking these medications    doxylamine (Sleep) 25 MG tablet Commonly known as: UNISOM   oxyCODONE 5 MG immediate release tablet Commonly known as: Oxy IR/ROXICODONE   promethazine 25 MG tablet Commonly known as: PHENERGAN   pyridOXINE 25 MG tablet Commonly known as: VITAMIN B6   scopolamine 1 MG/3DAYS Commonly known as: TRANSDERM-SCOP       TAKE these medications    Accu-Chek Guide Test test strip Generic drug: glucose blood Use as instructed   Accu-Chek Guide w/Device Kit 1 Device by Does not apply route 4 (four) times daily.   Accu-Chek Softclix Lancets lancets 1 each by Other route 4 (four) times daily.   acetaminophen 500 MG tablet Commonly known  as: TYLENOL Take 1-2  tablets (500-1,000 mg total) by mouth every 6 (six) hours as needed for headache.   aspirin EC 81 MG tablet Take 1 tablet (81 mg total) by mouth daily. Take after 12 weeks for prevention of preeclampsia later in pregnancy   cefadroxil 500 MG capsule Commonly known as: DURICEF Take 1 capsule (500 mg total) by mouth 2 (two) times daily.   ferrous sulfate 325 (65 FE) MG tablet Take 1 tablet (325 mg total) by mouth daily with breakfast.   metroNIDAZOLE 500 MG tablet Commonly known as: FLAGYL Take 1 tablet (500 mg total) by mouth once for 1 dose.   NIFEdipine 10 MG capsule Commonly known as: PROCARDIA Take 1 capsule (10 mg total) by mouth every 4 (four) hours as needed (contractions).   ondansetron 4 MG tablet Commonly known as: Zofran Take 1-2 tablets (4-8 mg total) by mouth every 8 (eight) hours as needed for nausea or vomiting.   pantoprazole 20 MG tablet Commonly known as: Protonix Take 1 tablet (20 mg total) by mouth daily.   Prenatal Vitamins 28-0.8 MG Tabs Take 1 tablet by mouth daily.         Wylene Simmer, MD OB Fellow 05/17/2023  6:54 AM

## 2023-05-17 NOTE — MAU Note (Signed)
POCT CBG 120 

## 2023-05-17 NOTE — MAU Note (Signed)
.  April Boone is a 38 y.o. at [redacted]w[redacted]d here in MAU reporting: pain in abdomen intermittent started at 1700 decreased in frequency-returning at 2300. Describes pain as contraction and increased pain on L side of uterus.  Denies SROM, vaginal bleeding or discharge Endorses + fetal movement  Onset of complaint: 1700 Pain score: 9 Vitals:   05/17/23 0446  BP: 103/66  Pulse: (!) 112  Resp: 17  Temp: 98.3 F (36.8 C)  SpO2: 100%     FHT: pt wearing dress will obtain heart rate in room  Lab orders placed from triage:

## 2023-05-18 ENCOUNTER — Encounter: Payer: Self-pay | Admitting: Obstetrics and Gynecology

## 2023-05-18 DIAGNOSIS — R8271 Bacteriuria: Secondary | ICD-10-CM | POA: Insufficient documentation

## 2023-05-18 LAB — CULTURE, OB URINE: Culture: 40000 — AB

## 2023-05-18 LAB — GLUCOSE, CAPILLARY: Glucose-Capillary: 120 mg/dL — ABNORMAL HIGH (ref 70–99)

## 2023-05-19 ENCOUNTER — Encounter: Attending: Obstetrics & Gynecology | Admitting: Dietician

## 2023-05-19 DIAGNOSIS — O24410 Gestational diabetes mellitus in pregnancy, diet controlled: Secondary | ICD-10-CM | POA: Insufficient documentation

## 2023-05-19 NOTE — Progress Notes (Signed)
 Patient was seen on 05/19/2023 for Gestational Diabetes self-management class at the [facility]. The following learning objectives were met by the patient during this course:  States the definition of Gestational Diabetes States why dietary management is important in controlling blood glucose Describes the effects each nutrient has on blood glucose levels Demonstrates ability to create a balanced meal plan Demonstrates carbohydrate counting  States when to check blood glucose levels Demonstrates proper blood glucose monitoring techniques States the effect of stress and exercise on blood glucose levels States the importance of limiting caffeine and abstaining from alcohol and smoking    Pt presents with accu chek meter, lancing device and sample lancets. Patient is instructed to pick up her supplies from pharmacy and begin testing pre breakfast and 2 hours after each meal. Blood glucose today in class 106, reported 2 hour post prandial; Pt was provided a single blood sugar strip expiration 11/05/23 ; Lot# [id number]  Patient instructed to monitor glucose levels: QID FBS: 60 - <90 1 hour: <140 2 hour: <120  *Patient received handouts: Nutrition Diabetes and Pregnancy Carbohydrate Counting List Blood glucose log Snack ideas for diabetes during pregnancy  Patient will be seen for follow-up as needed.

## 2023-05-21 ENCOUNTER — Ambulatory Visit: Admitting: Obstetrics and Gynecology

## 2023-05-21 ENCOUNTER — Encounter: Payer: Self-pay | Admitting: Obstetrics and Gynecology

## 2023-05-21 VITALS — BP 116/78 | HR 115 | Wt 187.6 lb

## 2023-05-21 DIAGNOSIS — Z3480 Encounter for supervision of other normal pregnancy, unspecified trimester: Secondary | ICD-10-CM

## 2023-05-21 DIAGNOSIS — O09529 Supervision of elderly multigravida, unspecified trimester: Secondary | ICD-10-CM | POA: Diagnosis not present

## 2023-05-21 DIAGNOSIS — Z3A30 30 weeks gestation of pregnancy: Secondary | ICD-10-CM | POA: Diagnosis not present

## 2023-05-21 DIAGNOSIS — O43199 Other malformation of placenta, unspecified trimester: Secondary | ICD-10-CM

## 2023-05-21 DIAGNOSIS — O24410 Gestational diabetes mellitus in pregnancy, diet controlled: Secondary | ICD-10-CM

## 2023-05-21 NOTE — Progress Notes (Signed)
 Pt presents for ROB visit. Pt c/o pain in the lower abd. Seen at MAU for this 05-17-23

## 2023-05-21 NOTE — Progress Notes (Signed)
   PRENATAL VISIT NOTE  Subjective:  April Boone is a 38 y.o. A6T0160 at [redacted]w[redacted]d being seen today for ongoing prenatal care.  She is currently monitored for the following issues for this low-risk pregnancy and has Supervision of other normal pregnancy, antepartum; AMA (advanced maternal age) multigravida 35+; Maternal obesity affecting pregnancy, antepartum; Marginal insertion of umbilical cord affecting management of mother; Gestational diabetes; and GBS bacteriuria on their problem list.  Patient reports seen in MAU, still having pain, but has gotten better   Contractions: Not present. Vag. Bleeding: None.  Movement: Present. Denies leaking of fluid.   The following portions of the patient's history were reviewed and updated as appropriate: allergies, current medications, past family history, past medical history, past social history, past surgical history and problem list.   Objective:   Vitals:   05/21/23 1025  BP: 116/78  Pulse: (!) 115  Weight: 187 lb 9.6 oz (85.1 kg)    Fetal Status: Fetal Heart Rate (bpm): 140   Movement: Present     General:  Alert, oriented and cooperative. Patient is in no acute distress.  Skin: Skin is warm and dry. No rash noted.   Cardiovascular: Normal heart rate noted  Respiratory: Normal respiratory effort, no problems with respiration noted  Abdomen: Soft, gravid, appropriate for gestational age.  Pain/Pressure: Present     Pelvic: Cervical exam deferred        Extremities: Normal range of motion.  Edema: None  Mental Status: Normal mood and affect. Normal behavior. Normal judgment and thought content.   Assessment and Plan:  Pregnancy: [id number] at [redacted]w[redacted]d 1. Supervision of other normal pregnancy, antepartum (Primary) BP and FHR normal feeling regular movement    2. Antepartum multigravida of advanced maternal age Following MFM  3. Marginal insertion of umbilical cord affecting management of mother Follow up u/s 4/1  4. [redacted] weeks gestation  of pregnancy Seen in MAU 3/17 abd pain, dx with UTI and BV, given procardia, abx,  Reports pain is getting better, discussed abx to continue, tylenol PRN, and strict precautions when to follow up  5. Diet controlled gestational diabetes mellitus (GDM) in second trimester Saw diabetic educatior 3/19, just started checking sugars, fasting and pp abnormal, discussed dietary changes limiting carb/sugar, importance of protein, if elevated next visit, discussed starting medication  Preterm labor symptoms and general obstetric precautions including but not limited to vaginal bleeding, contractions, leaking of fluid and fetal movement were reviewed in detail with the patient. Please refer to After Visit Summary for other counseling recommendations.   Return in about 2 weeks (around 06/04/2023) for OB VISIT (MD or APP).  Future Appointments  Date Time Provider Department Center  06/01/2023  8:30 AM [facility] US4 [facility]  06/04/2023  9:35 AM Ralene Muskrat, PA-C [facility] None    Albertine Grates, [place]

## 2023-06-01 ENCOUNTER — Ambulatory Visit: Payer: Medicaid Other | Attending: Obstetrics and Gynecology

## 2023-06-01 ENCOUNTER — Other Ambulatory Visit: Payer: Self-pay

## 2023-06-01 DIAGNOSIS — O43193 Other malformation of placenta, third trimester: Secondary | ICD-10-CM | POA: Diagnosis not present

## 2023-06-01 DIAGNOSIS — E669 Obesity, unspecified: Secondary | ICD-10-CM

## 2023-06-01 DIAGNOSIS — O09523 Supervision of elderly multigravida, third trimester: Secondary | ICD-10-CM

## 2023-06-01 DIAGNOSIS — O43199 Other malformation of placenta, unspecified trimester: Secondary | ICD-10-CM

## 2023-06-01 DIAGNOSIS — O24419 Gestational diabetes mellitus in pregnancy, unspecified control: Secondary | ICD-10-CM

## 2023-06-01 DIAGNOSIS — Z3A31 31 weeks gestation of pregnancy: Secondary | ICD-10-CM

## 2023-06-01 DIAGNOSIS — O24410 Gestational diabetes mellitus in pregnancy, diet controlled: Secondary | ICD-10-CM | POA: Diagnosis not present

## 2023-06-01 DIAGNOSIS — O99213 Obesity complicating pregnancy, third trimester: Secondary | ICD-10-CM | POA: Diagnosis not present

## 2023-06-01 NOTE — Progress Notes (Unsigned)
   PRENATAL VISIT NOTE  Subjective:  April Boone is a 38 y.o. Z6X0960 at [redacted]w[redacted]d being seen today for ongoing prenatal care.  She is currently monitored for the following issues for this {Blank single:19197::"high-risk","low-risk"} pregnancy and has Supervision of other normal pregnancy, antepartum; AMA (advanced maternal age) multigravida 35+; Maternal obesity affecting pregnancy, antepartum; Marginal insertion of umbilical cord affecting management of mother; Gestational diabetes; and GBS bacteriuria on their problem list.  Patient reports {sx:[id number]}.   .  .   . Denies leaking of fluid.   The following portions of the patient's history were reviewed and updated as appropriate: allergies, current medications, past family history, past medical history, past social history, past surgical history and problem list.   Objective:  There were no vitals filed for this visit.  Fetal Status:           General:  Alert, oriented and cooperative. Patient is in no acute distress.  Skin: Skin is warm and dry. No rash noted.   Cardiovascular: Normal heart rate noted  Respiratory: Normal respiratory effort, no problems with respiration noted  Abdomen: Soft, gravid, appropriate for gestational age.        Pelvic: Cervical exam deferred        Extremities: Normal range of motion.     Mental Status: Normal mood and affect. Normal behavior. Normal judgment and thought content.   Assessment and Plan:  Pregnancy: A5W0981 at [redacted]w[redacted]d  1. Supervision of other normal pregnancy, antepartum (Primary) Patient is doing well, feeling regular fetal movement BP, FHR, FH appropriate  2. [redacted] weeks gestation of pregnancy Anticipatory guidance about next visits/weeks of pregnancy given  3. Gestational diabetes mellitus (GDM) in third trimester, gestational diabetes method of control unspecified Controlled FBS: PPBS:  4. Multigravida of advanced maternal age in third trimester  5. BMI 30.0-30.9,adult  Preterm  labor symptoms and general obstetric precautions including but not limited to vaginal bleeding, contractions, leaking of fluid and fetal movement were reviewed in detail with the patient.  Please refer to After Visit Summary for other counseling recommendations.   No follow-ups on file.  Future Appointments  Date Time Provider Department Center  06/04/2023  9:35 AM Christean Leaf [facility] None  06/29/2023  8:00 AM [facility] PROVIDER 1 [facility]  06/29/2023  8:30 AM [facility] US6 [facility]    Ralene Muskrat, PA-C

## 2023-06-04 ENCOUNTER — Ambulatory Visit: Admitting: Physician Assistant

## 2023-06-04 VITALS — BP 119/82 | HR 112 | Wt 186.2 lb

## 2023-06-04 DIAGNOSIS — O09523 Supervision of elderly multigravida, third trimester: Secondary | ICD-10-CM

## 2023-06-04 DIAGNOSIS — Z3480 Encounter for supervision of other normal pregnancy, unspecified trimester: Secondary | ICD-10-CM

## 2023-06-04 DIAGNOSIS — Z3A32 32 weeks gestation of pregnancy: Secondary | ICD-10-CM

## 2023-06-04 DIAGNOSIS — Z6830 Body mass index (BMI) 30.0-30.9, adult: Secondary | ICD-10-CM

## 2023-06-04 DIAGNOSIS — R5383 Other fatigue: Secondary | ICD-10-CM

## 2023-06-04 DIAGNOSIS — O24419 Gestational diabetes mellitus in pregnancy, unspecified control: Secondary | ICD-10-CM

## 2023-06-04 NOTE — Progress Notes (Signed)
 Pt presents for rob. Pt states that she is more tired than normal.

## 2023-06-05 LAB — VITAMIN D 25 HYDROXY (VIT D DEFICIENCY, FRACTURES): Vit D, 25-Hydroxy: 12 ng/mL — ABNORMAL LOW (ref 30.0–100.0)

## 2023-06-05 LAB — TSH RFX ON ABNORMAL TO FREE T4: TSH: 1.01 u[IU]/mL (ref 0.450–4.500)

## 2023-06-07 ENCOUNTER — Other Ambulatory Visit: Payer: Self-pay

## 2023-06-07 MED ORDER — VITAMIN D (CHOLECALCIFEROL) 25 MCG (1000 UT) PO CAPS: 1.0000 | ORAL_CAPSULE | Freq: Every day | ORAL | 3 refills | Status: DC

## 2023-06-07 NOTE — Progress Notes (Signed)
 Vitamin d rx sent to pt pharmacy per request

## 2023-06-08 ENCOUNTER — Other Ambulatory Visit: Payer: Self-pay

## 2023-06-08 DIAGNOSIS — O24410 Gestational diabetes mellitus in pregnancy, diet controlled: Secondary | ICD-10-CM

## 2023-06-08 MED ORDER — ACCU-CHEK SOFTCLIX LANCETS MISC: 1.0000 | Freq: Four times a day (QID) | 12 refills | Status: DC

## 2023-06-08 MED ORDER — ACCU-CHEK GUIDE TEST VI STRP: ORAL_STRIP | 12 refills | Status: DC

## 2023-06-08 NOTE — Progress Notes (Signed)
 Pt called reporting running out of test strips and lancets. Called pharmacy for refill, earliest pt could get refill is 4/15. Pt has went through all test strips and lancets due to error with using machine occasionally. New order placed for quantity of test strips and lancets to be increased from 100 to 200 so that pt does not continue to run out before refills are allowed.

## 2023-06-15 ENCOUNTER — Ambulatory Visit: Admitting: Obstetrics and Gynecology

## 2023-06-15 VITALS — BP 104/71 | HR 120 | Temp 98.1°F | Wt 185.0 lb

## 2023-06-15 DIAGNOSIS — O24414 Gestational diabetes mellitus in pregnancy, insulin controlled: Secondary | ICD-10-CM

## 2023-06-15 DIAGNOSIS — R8271 Bacteriuria: Secondary | ICD-10-CM

## 2023-06-15 DIAGNOSIS — Z3480 Encounter for supervision of other normal pregnancy, unspecified trimester: Secondary | ICD-10-CM

## 2023-06-15 DIAGNOSIS — Z3A33 33 weeks gestation of pregnancy: Secondary | ICD-10-CM

## 2023-06-15 DIAGNOSIS — Z23 Encounter for immunization: Secondary | ICD-10-CM | POA: Diagnosis not present

## 2023-06-15 DIAGNOSIS — O368130 Decreased fetal movements, third trimester, not applicable or unspecified: Secondary | ICD-10-CM | POA: Diagnosis not present

## 2023-06-15 DIAGNOSIS — O43199 Other malformation of placenta, unspecified trimester: Secondary | ICD-10-CM

## 2023-06-15 DIAGNOSIS — O09523 Supervision of elderly multigravida, third trimester: Secondary | ICD-10-CM

## 2023-06-15 DIAGNOSIS — Z6833 Body mass index (BMI) 33.0-33.9, adult: Secondary | ICD-10-CM

## 2023-06-15 MED ORDER — PEN NEEDLES 33G X 4 MM MISC: 5 refills | Status: DC

## 2023-06-15 MED ORDER — INSULIN GLARGINE 100 UNIT/ML SOLOSTAR PEN: 6.0000 [IU] | PEN_INJECTOR | Freq: Every day | SUBCUTANEOUS | 3 refills | Status: DC

## 2023-06-15 NOTE — Progress Notes (Signed)
 ROB, c/o fatigue, "not feeling herself"

## 2023-06-20 NOTE — Progress Notes (Signed)
   PRENATAL VISIT NOTE  Subjective:  April Boone is a 38 y.o. [id number] at [redacted]w[redacted]d being seen today for ongoing prenatal care.  She is currently monitored for the following issues for this high-risk pregnancy and has Supervision of other normal pregnancy, antepartum; AMA (advanced maternal age) multigravida 35+; Maternal obesity affecting pregnancy, antepartum; Marginal insertion of umbilical cord affecting management of mother; Gestational diabetes; and GBS bacteriuria on their problem list.  Patient reports  fatigue . Improving from last appointment since starting vitamin D . Getting better w/ rest. Occasional headaches but tylenol  helps.  Contractions: Not present. Vag. Bleeding: None.  Movement: Present. Denies leaking of fluid.   The following portions of the patient's history were reviewed and updated as appropriate: allergies, current medications, past family history, past medical history, past social history, past surgical history and problem list.   Objective:   Vitals:   06/15/23 1005  BP: 104/71  Pulse: (!) 120  Temp: 98.1 F (36.7 C)  Weight: 185 lb (83.9 kg)    Fetal Status: Fetal Heart Rate (bpm): 146   Movement: Present     General:  Alert, oriented and cooperative. Patient is in no acute distress.  Skin: Skin is warm and dry. No rash noted.   Cardiovascular: Normal heart rate noted  Respiratory: Normal respiratory effort, no problems with respiration noted  Abdomen: Soft, gravid, appropriate for gestational age.  Pain/Pressure: Present      Assessment and Plan:  Pregnancy: [id number] at [redacted]w[redacted]d 1. Supervision of other normal pregnancy, antepartum (Primary) 2. [redacted] weeks gestation of pregnancy Declined interpreter today Tdap today  3. Decreased fetal movements in third trimester, single or unspecified fetus NST reactive and reassuring Discussed return precautions for DFM - Fetal nonstress test; Future  4. Insulin  controlled gestational diabetes mellitus (GDM) in  third trimester BG reviewed - >50% of fasting values are elevated 100+ Recommended insulin  - pt accepts Rx sent for lantus  6u at bedtime Growth @ 31/6: 1989g (61%), AC 83%, cephalic, anterior, 9.51 Weekly antenatal testing ordered. NST reactive & reassuring today - US  MFM FETAL BPP WO NON STRESS; Future  5. GBS bacteriuria PCN in labor  6. Multigravida of advanced maternal age in third trimester 7. Marginal insertion of umbilical cord affecting management of mother 8. BMI 33.0-33.9,adult ldASA rx -not taking Growth as above LR NIPS  Please refer to After Visit Summary for other counseling recommendations.   Return in about 1 week (around 06/22/2023) for NST/BPP then ROB in 2 weeks.  Future Appointments  Date Time Provider Department Center  06/22/2023  2:50 PM Gabrielle Joiner, MD [facility] None  06/29/2023  8:00 AM [facility] PROVIDER 1 [facility]  06/29/2023  8:30 AM [facility] US6 [facility]    Izell Marsh, MD

## 2023-06-22 ENCOUNTER — Encounter: Payer: Self-pay | Admitting: Obstetrics and Gynecology

## 2023-06-22 ENCOUNTER — Encounter: Payer: Self-pay | Admitting: Obstetrics

## 2023-06-22 ENCOUNTER — Ambulatory Visit (INDEPENDENT_AMBULATORY_CARE_PROVIDER_SITE_OTHER): Admitting: Obstetrics

## 2023-06-22 VITALS — BP 117/85 | HR 116 | Wt 185.0 lb

## 2023-06-22 DIAGNOSIS — O368130 Decreased fetal movements, third trimester, not applicable or unspecified: Secondary | ICD-10-CM | POA: Diagnosis not present

## 2023-06-22 DIAGNOSIS — R8271 Bacteriuria: Secondary | ICD-10-CM

## 2023-06-22 DIAGNOSIS — Z3A34 34 weeks gestation of pregnancy: Secondary | ICD-10-CM

## 2023-06-22 DIAGNOSIS — O99210 Obesity complicating pregnancy, unspecified trimester: Secondary | ICD-10-CM

## 2023-06-22 DIAGNOSIS — O09523 Supervision of elderly multigravida, third trimester: Secondary | ICD-10-CM | POA: Diagnosis not present

## 2023-06-22 DIAGNOSIS — O24414 Gestational diabetes mellitus in pregnancy, insulin controlled: Secondary | ICD-10-CM

## 2023-06-22 DIAGNOSIS — O43199 Other malformation of placenta, unspecified trimester: Secondary | ICD-10-CM

## 2023-06-22 DIAGNOSIS — O99213 Obesity complicating pregnancy, third trimester: Secondary | ICD-10-CM

## 2023-06-22 DIAGNOSIS — O0990 Supervision of high risk pregnancy, unspecified, unspecified trimester: Secondary | ICD-10-CM

## 2023-06-22 DIAGNOSIS — Z3480 Encounter for supervision of other normal pregnancy, unspecified trimester: Secondary | ICD-10-CM

## 2023-06-22 NOTE — Progress Notes (Signed)
 Pt presents for NST. NST charted.

## 2023-06-22 NOTE — Progress Notes (Signed)
 Subjective:  April Boone is a 38 y.o. [id number] at [redacted]w[redacted]d being seen today for ongoing prenatal care.  She is currently monitored for the following issues for this high-risk pregnancy and has Supervision of other normal pregnancy, antepartum; AMA (advanced maternal age) multigravida 35+; Maternal obesity affecting pregnancy, antepartum; Marginal insertion of umbilical cord affecting management of mother; Gestational diabetes; and GBS bacteriuria on their problem list.  Patient reports no complaints.  Contractions: Not present. Vag. Bleeding: None.  Movement: Present. Denies leaking of fluid.   The following portions of the patient's history were reviewed and updated as appropriate: allergies, current medications, past family history, past medical history, past social history, past surgical history and problem list. Problem list updated.  Objective:   Vitals:   06/22/23 1510  BP: 117/85  Pulse: (!) 116  Weight: 185 lb (83.9 kg)    Fetal Status: Fetal Heart Rate (bpm):  (NST done, charted)   Movement: Present     General:  Alert, oriented and cooperative. Patient is in no acute distress.  Skin: Skin is warm and dry. No rash noted.   Cardiovascular: Normal heart rate noted  Respiratory: Normal respiratory effort, no problems with respiration noted  Abdomen: Soft, gravid, appropriate for gestational age. Pain/Pressure: Present     Pelvic:  Cervical exam deferred        Extremities: Normal range of motion.  Edema: None  Mental Status: Normal mood and affect. Normal behavior. Normal judgment and thought content.   Urinalysis:      Assessment and Plan:  Pregnancy: A5W0981 at [redacted]w[redacted]d  1. Supervision of high risk pregnancy, antepartum (Primary)  2. Multigravida of advanced maternal age in third trimester  3. Decreased fetal movements in third trimester, single or unspecified fetus, resolved over the past week - fetal movement much improved, and now normal NST::  Reactive.  FHR 140-155  bpm.  Good variability.  15x15 accels.  No decels  4. Insulin  controlled gestational diabetes mellitus (GDM) in third trimester - good glucose control  5. GBS bacteriuria, treated - treat in labor  6. Marginal insertion of umbilical cord affecting management of mother  7. Obesity affecting pregnancy, antepartum, unspecified obesity type   Preterm labor symptoms and general obstetric precautions including but not limited to vaginal bleeding, contractions, leaking of fluid and fetal movement were reviewed in detail with the patient. Please refer to After Visit Summary for other counseling recommendations.   Return in about 1 week (around 06/29/2023) for [facility].   Gabrielle Joiner, MD 06/22/2023

## 2023-06-29 ENCOUNTER — Other Ambulatory Visit: Payer: Self-pay | Admitting: *Deleted

## 2023-06-29 ENCOUNTER — Other Ambulatory Visit (HOSPITAL_COMMUNITY)
Admission: RE | Admit: 2023-06-29 | Discharge: 2023-06-29 | Disposition: A | Discharge status: Home / Self Care | Source: Ambulatory Visit | Attending: Advanced Practice Midwife | Admitting: Advanced Practice Midwife

## 2023-06-29 ENCOUNTER — Ambulatory Visit: Admitting: Advanced Practice Midwife

## 2023-06-29 ENCOUNTER — Encounter: Payer: Self-pay | Admitting: Advanced Practice Midwife

## 2023-06-29 ENCOUNTER — Ambulatory Visit

## 2023-06-29 ENCOUNTER — Ambulatory Visit: Attending: Maternal & Fetal Medicine | Admitting: Obstetrics

## 2023-06-29 VITALS — BP 108/72

## 2023-06-29 VITALS — BP 118/79 | HR 99 | Wt 188.8 lb

## 2023-06-29 DIAGNOSIS — R8271 Bacteriuria: Secondary | ICD-10-CM

## 2023-06-29 DIAGNOSIS — O99213 Obesity complicating pregnancy, third trimester: Secondary | ICD-10-CM

## 2023-06-29 DIAGNOSIS — O24414 Gestational diabetes mellitus in pregnancy, insulin controlled: Secondary | ICD-10-CM

## 2023-06-29 DIAGNOSIS — O10913 Unspecified pre-existing hypertension complicating pregnancy, third trimester: Secondary | ICD-10-CM

## 2023-06-29 DIAGNOSIS — O0990 Supervision of high risk pregnancy, unspecified, unspecified trimester: Secondary | ICD-10-CM

## 2023-06-29 DIAGNOSIS — O09523 Supervision of elderly multigravida, third trimester: Secondary | ICD-10-CM | POA: Diagnosis not present

## 2023-06-29 DIAGNOSIS — Z3A35 35 weeks gestation of pregnancy: Secondary | ICD-10-CM

## 2023-06-29 DIAGNOSIS — O24410 Gestational diabetes mellitus in pregnancy, diet controlled: Secondary | ICD-10-CM

## 2023-06-29 DIAGNOSIS — O24419 Gestational diabetes mellitus in pregnancy, unspecified control: Secondary | ICD-10-CM

## 2023-06-29 DIAGNOSIS — O368130 Decreased fetal movements, third trimester, not applicable or unspecified: Secondary | ICD-10-CM

## 2023-06-29 DIAGNOSIS — Z3480 Encounter for supervision of other normal pregnancy, unspecified trimester: Secondary | ICD-10-CM | POA: Diagnosis not present

## 2023-06-29 DIAGNOSIS — E669 Obesity, unspecified: Secondary | ICD-10-CM

## 2023-06-29 DIAGNOSIS — O99210 Obesity complicating pregnancy, unspecified trimester: Secondary | ICD-10-CM

## 2023-06-29 DIAGNOSIS — O43193 Other malformation of placenta, third trimester: Secondary | ICD-10-CM

## 2023-06-29 DIAGNOSIS — O43199 Other malformation of placenta, unspecified trimester: Secondary | ICD-10-CM

## 2023-06-29 DIAGNOSIS — B3731 Acute candidiasis of vulva and vagina: Secondary | ICD-10-CM

## 2023-06-29 NOTE — Progress Notes (Signed)
 Pt presents for ROB visit. No concerns

## 2023-06-29 NOTE — Addendum Note (Signed)
 Addended by: Arlester Bence A on: 06/29/2023 06:07 PM   Modules accepted: Orders

## 2023-06-29 NOTE — Progress Notes (Signed)
 MFM Consult Note  April Boone is currently at 35 weeks and 6 days.  She has been followed due to maternal obesity, advanced maternal age (38 years old), and gestational diabetes.    The patient reports that she was told that a prescription would be sent for insulin  following her prenatal visit last week. However, she reports that the prescription was never sent.  She reports that her fasting fingerstick values have been in the low 90s range and her 2-hour postprandial fingerstick values are all less than 125.  On today's exam, the overall EFW of 6 pounds 6 ounces measures at the 62nd percentile for her gestational age.    There was normal amniotic fluid noted with a total AFI of 12.48 cm.  A BPP performed today was 8 out of 8.  The patient was advised to continue to monitor her fingerstick values on a daily basis.    Should her glycemic control be mostly within normal limits, delivery will be recommended at around 39 weeks.    However should her glycemic control be poor, delivery may be considered at around 37 weeks.    She will return in 1 week for another BPP.    The patient stated that all of her questions were answered today.  A total of 20 minutes was spent counseling and coordinating the care for this patient.  Greater than 50% of the time was spent in direct face-to-face contact.

## 2023-06-29 NOTE — Progress Notes (Signed)
   PRENATAL VISIT NOTE  Subjective:  April Boone is a 38 y.o. [id number] at [redacted]w[redacted]d being seen today for ongoing prenatal care.  She is currently monitored for the following issues for this high-risk pregnancy and has Supervision of high risk pregnancy, antepartum; AMA (advanced maternal age) multigravida 35+; Maternal obesity affecting pregnancy, antepartum; Marginal insertion of umbilical cord affecting management of mother; Gestational diabetes; and GBS bacteriuria on their problem list.  Patient reports  intermittent decreased fetal movement .  Contractions: Not present. Vag. Bleeding: None.  Movement: Present. Denies leaking of fluid.   The following portions of the patient's history were reviewed and updated as appropriate: allergies, current medications, past family history, past medical history, past social history, past surgical history and problem list.   Objective:   Vitals:   06/29/23 1346  BP: 118/79  Pulse: 99  Weight: 188 lb 12.8 oz (85.6 kg)    Fetal Status: Fetal Heart Rate (bpm): 143 Fundal Height: 36 cm Movement: Present     General:  Alert, oriented and cooperative. Patient is in no acute distress.  Skin: Skin is warm and dry. No rash noted.   Cardiovascular: Normal heart rate noted  Respiratory: Normal respiratory effort, no problems with respiration noted  Abdomen: Soft, gravid, appropriate for gestational age.  Pain/Pressure: Absent     Pelvic: Cervical exam performed in the presence of a chaperone Dilation: 1 Effacement (%): 50 Station: -2  Extremities: Normal range of motion.  Edema: None  Mental Status: Normal mood and affect. Normal behavior. Normal judgment and thought content.   Assessment and Plan:  Pregnancy: [name] at [redacted]w[redacted]d 1. Supervision of high risk pregnancy, antepartum (Primary) --Anticipatory guidance about next visits/weeks of pregnancy given.  --GCC collected today, GBS + urine  2. Multigravida of advanced maternal age in third  trimester   3. Decreased fetal movements in third trimester, single or unspecified fetus --BPP 8/8 this morning and pt feeling some movement in office.  Kick counts given/ reasons to return to care  4. Insulin  controlled gestational diabetes mellitus (GDM) in third trimester --Reviewed glucose log.  Fasting values with 3 out of 12 elevated and PP with 3-4 out of 25 elevated, highest PP 150s.  Prior to this week, ~ 30% fasting values elevated and a larger number of PP elevated, highest 180.  Pt reports diet changes, eating more vegetables and eating protein at night.  She reports she never started the medication, so is currently diet controlled and in good control.   5. GBS bacteriuria --treat in labor  6. [redacted] weeks gestation of pregnancy   Preterm labor symptoms and general obstetric precautions including but not limited to vaginal bleeding, contractions, leaking of fluid and fetal movement were reviewed in detail with the patient. Please refer to After Visit Summary for other counseling recommendations.   Return in about 1 week (around 07/06/2023) for HROB.  Future Appointments  Date Time Provider Department Center  07/06/2023 11:15 AM Tresia Fruit, MD [facility] None    Arlester Bence, CNM

## 2023-06-30 LAB — CERVICOVAGINAL ANCILLARY ONLY
Bacterial Vaginitis (gardnerella): NEGATIVE
Candida Glabrata: NEGATIVE
Candida Vaginitis: POSITIVE — AB
Chlamydia: NEGATIVE
Comment: NEGATIVE
Comment: NEGATIVE
Comment: NEGATIVE
Comment: NEGATIVE
Comment: NEGATIVE
Comment: NORMAL
Neisseria Gonorrhea: NEGATIVE
Trichomonas: NEGATIVE

## 2023-07-03 ENCOUNTER — Encounter: Payer: Self-pay | Admitting: Advanced Practice Midwife

## 2023-07-03 MED ORDER — FLUCONAZOLE 150 MG PO TABS: ORAL_TABLET | ORAL | 0 refills | Status: DC

## 2023-07-03 NOTE — Addendum Note (Signed)
 Addended by: Arlester Bence A on: 07/03/2023 10:46 AM   Modules accepted: Orders

## 2023-07-06 ENCOUNTER — Ambulatory Visit (INDEPENDENT_AMBULATORY_CARE_PROVIDER_SITE_OTHER): Admitting: Obstetrics & Gynecology

## 2023-07-06 VITALS — BP 124/78 | HR 111 | Wt 183.9 lb

## 2023-07-06 DIAGNOSIS — O0990 Supervision of high risk pregnancy, unspecified, unspecified trimester: Secondary | ICD-10-CM

## 2023-07-06 DIAGNOSIS — R8271 Bacteriuria: Secondary | ICD-10-CM | POA: Diagnosis not present

## 2023-07-06 DIAGNOSIS — O24419 Gestational diabetes mellitus in pregnancy, unspecified control: Secondary | ICD-10-CM

## 2023-07-06 DIAGNOSIS — O09523 Supervision of elderly multigravida, third trimester: Secondary | ICD-10-CM

## 2023-07-06 NOTE — Progress Notes (Signed)
 Pt presents for HOB. No questions or concerns.

## 2023-07-06 NOTE — Progress Notes (Signed)
   PRENATAL VISIT NOTE  Subjective:  April Boone is a 38 y.o. [id number] at [redacted]w[redacted]d being seen today for ongoing prenatal care.  She is currently monitored for the following issues for this high-risk pregnancy and has Supervision of high risk pregnancy, antepartum; AMA (advanced maternal age) multigravida 35+; Maternal obesity affecting pregnancy, antepartum; Marginal insertion of umbilical cord affecting management of mother; Gestational diabetes; and GBS bacteriuria on their problem list.  Patient reports no complaints.  Contractions: Not present. Vag. Bleeding: None.  Movement: Present. Denies leaking of fluid.   The following portions of the patient's history were reviewed and updated as appropriate: allergies, current medications, past family history, past medical history, past social history, past surgical history and problem list.   Objective:   Vitals:   07/06/23 1130  BP: 124/78  Pulse: (!) 111  Weight: 183 lb 14.4 oz (83.4 kg)    Fetal Status: Fetal Heart Rate (bpm): 135   Movement: Present     General:  Alert, oriented and cooperative. Patient is in no acute distress.  Skin: Skin is warm and dry. No rash noted.   Cardiovascular: Normal heart rate noted  Respiratory: Normal respiratory effort, no problems with respiration noted  Abdomen: Soft, gravid, appropriate for gestational age.  Pain/Pressure: Present     Pelvic: Cervical exam deferred        Extremities: Normal range of motion.  Edema: None  Mental Status: Normal mood and affect. Normal behavior. Normal judgment and thought content.   Assessment and Plan:  Pregnancy: A5W0981 at [redacted]w[redacted]d 1. Gestational diabetes mellitus (GDM) in third trimester, gestational diabetes method of control unspecified (Primary) BG >80% in range  2. Multigravida of advanced maternal age in third trimester   3. Supervision of high risk pregnancy, antepartum IOL on 5/9 is scheduled  4. GBS bacteriuria   Preterm labor symptoms and  general obstetric precautions including but not limited to vaginal bleeding, contractions, leaking of fluid and fetal movement were reviewed in detail with the patient. Please refer to After Visit Summary for other counseling recommendations.   Return if symptoms worsen or fail to improve.  Future Appointments  Date Time Provider Department Center  07/09/2023  6:30 AM [facility] SCHED ROOM [facility] None    Onnie Bilis, MD

## 2023-07-09 ENCOUNTER — Inpatient Hospital Stay (HOSPITAL_COMMUNITY): Admitting: Anesthesiology

## 2023-07-09 ENCOUNTER — Inpatient Hospital Stay (HOSPITAL_COMMUNITY)
Admission: RE | Admit: 2023-07-09 | Discharge: 2023-07-11 | DRG: 807 | Disposition: A | Discharge status: Home / Self Care | Attending: Obstetrics and Gynecology | Admitting: Obstetrics and Gynecology

## 2023-07-09 ENCOUNTER — Inpatient Hospital Stay (HOSPITAL_COMMUNITY)

## 2023-07-09 ENCOUNTER — Encounter (HOSPITAL_COMMUNITY): Payer: Self-pay | Admitting: Family Medicine

## 2023-07-09 ENCOUNTER — Other Ambulatory Visit: Payer: Self-pay

## 2023-07-09 DIAGNOSIS — O99820 Streptococcus B carrier state complicating pregnancy: Secondary | ICD-10-CM | POA: Diagnosis not present

## 2023-07-09 DIAGNOSIS — Z8759 Personal history of other complications of pregnancy, childbirth and the puerperium: Secondary | ICD-10-CM

## 2023-07-09 DIAGNOSIS — O99210 Obesity complicating pregnancy, unspecified trimester: Secondary | ICD-10-CM | POA: Diagnosis present

## 2023-07-09 DIAGNOSIS — O24414 Gestational diabetes mellitus in pregnancy, insulin controlled: Secondary | ICD-10-CM

## 2023-07-09 DIAGNOSIS — O99824 Streptococcus B carrier state complicating childbirth: Secondary | ICD-10-CM | POA: Diagnosis present

## 2023-07-09 DIAGNOSIS — O09529 Supervision of elderly multigravida, unspecified trimester: Secondary | ICD-10-CM

## 2023-07-09 DIAGNOSIS — O43193 Other malformation of placenta, third trimester: Secondary | ICD-10-CM | POA: Diagnosis present

## 2023-07-09 DIAGNOSIS — R8271 Bacteriuria: Secondary | ICD-10-CM | POA: Diagnosis present

## 2023-07-09 DIAGNOSIS — O99214 Obesity complicating childbirth: Secondary | ICD-10-CM | POA: Diagnosis present

## 2023-07-09 DIAGNOSIS — O4423 Partial placenta previa NOS or without hemorrhage, third trimester: Secondary | ICD-10-CM | POA: Diagnosis not present

## 2023-07-09 DIAGNOSIS — O24420 Gestational diabetes mellitus in childbirth, diet controlled: Principal | ICD-10-CM | POA: Diagnosis present

## 2023-07-09 DIAGNOSIS — O43199 Other malformation of placenta, unspecified trimester: Secondary | ICD-10-CM | POA: Diagnosis present

## 2023-07-09 DIAGNOSIS — Z8632 Personal history of gestational diabetes: Secondary | ICD-10-CM | POA: Diagnosis present

## 2023-07-09 DIAGNOSIS — O09523 Supervision of elderly multigravida, third trimester: Secondary | ICD-10-CM | POA: Diagnosis not present

## 2023-07-09 DIAGNOSIS — Z3A37 37 weeks gestation of pregnancy: Secondary | ICD-10-CM | POA: Diagnosis not present

## 2023-07-09 DIAGNOSIS — O24419 Gestational diabetes mellitus in pregnancy, unspecified control: Principal | ICD-10-CM | POA: Diagnosis present

## 2023-07-09 LAB — CBC
HCT: 30.4 % — ABNORMAL LOW (ref 36.0–46.0)
Hemoglobin: 9.9 g/dL — ABNORMAL LOW (ref 12.0–15.0)
MCH: 26.4 pg (ref 26.0–34.0)
MCHC: 32.6 g/dL (ref 30.0–36.0)
MCV: 81.1 fL (ref 80.0–100.0)
Platelets: 214 10*3/uL (ref 150–400)
RBC: 3.75 MIL/uL — ABNORMAL LOW (ref 3.87–5.11)
RDW: 13.5 % (ref 11.5–15.5)
WBC: 7.9 10*3/uL (ref 4.0–10.5)
nRBC: 0 % (ref 0.0–0.2)

## 2023-07-09 LAB — GLUCOSE, CAPILLARY
Glucose-Capillary: 99 mg/dL (ref 70–99)
Glucose-Capillary: 77 mg/dL (ref 70–99)
Glucose-Capillary: 82 mg/dL (ref 70–99)
Glucose-Capillary: 80 mg/dL (ref 70–99)

## 2023-07-09 LAB — TYPE AND SCREEN
ABO/RH(D): B POS
Antibody Screen: NEGATIVE

## 2023-07-09 LAB — RPR: RPR Ser Ql: NONREACTIVE

## 2023-07-09 MED ORDER — LIDOCAINE HCL (PF) 1 % IJ SOLN
INTRAMUSCULAR | Status: DC | PRN
  Administered 2023-07-09: 8 mL via EPIDURAL

## 2023-07-09 MED ORDER — SOD CITRATE-CITRIC ACID 500-334 MG/5ML PO SOLN
30.0000 mL | ORAL | Status: DC | PRN
  Administered 2023-07-09: 30 mL via ORAL
  Filled 2023-07-09: qty 30

## 2023-07-09 MED ORDER — OXYTOCIN-SODIUM CHLORIDE 30-0.9 UT/500ML-% IV SOLN
2.5000 [IU]/h | INTRAVENOUS | Status: DC
  Filled 2023-07-09: qty 500

## 2023-07-09 MED ORDER — LACTATED RINGERS IV SOLN
500.0000 mL | Freq: Once | INTRAVENOUS | Status: DC
Start: 2023-07-09 — End: 2023-07-10

## 2023-07-09 MED ORDER — LACTATED RINGERS IV SOLN: 500.0000 mL | Freq: Once | INTRAVENOUS | Status: DC

## 2023-07-09 MED ORDER — OXYTOCIN-SODIUM CHLORIDE 30-0.9 UT/500ML-% IV SOLN
1.0000 m[IU]/min | INTRAVENOUS | Status: DC
  Administered 2023-07-09: 1 m[IU]/min via INTRAVENOUS

## 2023-07-09 MED ORDER — EPHEDRINE 5 MG/ML INJ: 10.0000 mg | INTRAVENOUS | Status: DC | PRN

## 2023-07-09 MED ORDER — OXYTOCIN BOLUS FROM INFUSION
333.0000 mL | Freq: Once | INTRAVENOUS | Status: AC
  Administered 2023-07-10: 333 mL via INTRAVENOUS

## 2023-07-09 MED ORDER — ACETAMINOPHEN 325 MG PO TABS: 650.0000 mg | ORAL_TABLET | ORAL | Status: DC | PRN

## 2023-07-09 MED ORDER — TERBUTALINE SULFATE 1 MG/ML IJ SOLN
0.2500 mg | Freq: Once | INTRAMUSCULAR | Status: AC | PRN
  Administered 2023-07-09: 0.25 mg via SUBCUTANEOUS
  Filled 2023-07-09: qty 1

## 2023-07-09 MED ORDER — DIPHENHYDRAMINE HCL 50 MG/ML IJ SOLN
12.5000 mg | INTRAMUSCULAR | Status: DC | PRN
  Administered 2023-07-09 – 2023-07-10 (×2): 12.5 mg via INTRAVENOUS
  Filled 2023-07-09 (×2): qty 1

## 2023-07-09 MED ORDER — PHENYLEPHRINE 80 MCG/ML (10ML) SYRINGE FOR IV PUSH (FOR BLOOD PRESSURE SUPPORT): 80.0000 ug | PREFILLED_SYRINGE | INTRAVENOUS | Status: DC | PRN

## 2023-07-09 MED ORDER — SODIUM CHLORIDE 0.9 % IV SOLN
5.0000 10*6.[IU] | Freq: Once | INTRAVENOUS | Status: AC
  Administered 2023-07-09: 5 10*6.[IU] via INTRAVENOUS
  Filled 2023-07-09: qty 5

## 2023-07-09 MED ORDER — DIPHENHYDRAMINE HCL 50 MG/ML IJ SOLN: 12.5000 mg | INTRAMUSCULAR | Status: DC | PRN

## 2023-07-09 MED ORDER — FENTANYL CITRATE (PF) 100 MCG/2ML IJ SOLN
100.0000 ug | Freq: Once | INTRAMUSCULAR | Status: AC
  Administered 2023-07-09: 100 ug via INTRAVENOUS
  Filled 2023-07-09: qty 2

## 2023-07-09 MED ORDER — LIDOCAINE HCL (PF) 1 % IJ SOLN: 30.0000 mL | INTRAMUSCULAR | Status: DC | PRN

## 2023-07-09 MED ORDER — ONDANSETRON HCL 4 MG/2ML IJ SOLN
4.0000 mg | Freq: Four times a day (QID) | INTRAMUSCULAR | Status: DC | PRN
  Administered 2023-07-09 – 2023-07-10 (×2): 4 mg via INTRAVENOUS
  Filled 2023-07-09 (×2): qty 2

## 2023-07-09 MED ORDER — LACTATED RINGERS IV SOLN
500.0000 mL | INTRAVENOUS | Status: AC | PRN
Start: 2023-07-09 — End: 2023-07-10

## 2023-07-09 MED ORDER — OXYCODONE-ACETAMINOPHEN 5-325 MG PO TABS: 1.0000 | ORAL_TABLET | ORAL | Status: DC | PRN

## 2023-07-09 MED ORDER — FENTANYL-BUPIVACAINE-NACL 0.5-0.125-0.9 MG/250ML-% EP SOLN: 12.0000 mL/h | EPIDURAL | Status: DC | PRN

## 2023-07-09 MED ORDER — PENICILLIN G POT IN DEXTROSE 60000 UNIT/ML IV SOLN
3.0000 10*6.[IU] | INTRAVENOUS | Status: DC
  Administered 2023-07-09 – 2023-07-10 (×6): 3 10*6.[IU] via INTRAVENOUS
  Filled 2023-07-09 (×6): qty 50

## 2023-07-09 MED ORDER — MISOPROSTOL 25 MCG QUARTER TABLET
25.0000 ug | ORAL_TABLET | Freq: Once | ORAL | Status: AC
  Administered 2023-07-09: 25 ug via VAGINAL
  Filled 2023-07-09: qty 1

## 2023-07-09 MED ORDER — OXYCODONE-ACETAMINOPHEN 5-325 MG PO TABS: 2.0000 | ORAL_TABLET | ORAL | Status: DC | PRN

## 2023-07-09 MED ORDER — FENTANYL-BUPIVACAINE-NACL 0.5-0.125-0.9 MG/250ML-% EP SOLN
12.0000 mL/h | EPIDURAL | Status: DC | PRN
  Administered 2023-07-09 – 2023-07-10 (×2): 12 mL/h via EPIDURAL
  Filled 2023-07-09 (×2): qty 250

## 2023-07-09 MED ORDER — MISOPROSTOL 50MCG HALF TABLET
50.0000 ug | ORAL_TABLET | Freq: Once | ORAL | Status: AC
  Administered 2023-07-09: 50 ug via ORAL
  Filled 2023-07-09: qty 1

## 2023-07-09 MED ORDER — LACTATED RINGERS IV SOLN: INTRAVENOUS | Status: AC

## 2023-07-09 NOTE — Progress Notes (Signed)
 Patient ID: April Boone, female   DOB: [birth year], 38 y.o.   MRN: [id number]  April Boone is a 38 y.o. [id number] at [redacted]w[redacted]d.  Subjective: Feeling increased pressure with contractions. Resting comfortably with epidural.  Objective: BP (!) 103/31   Pulse (!) 109   Temp 98.2 F (36.8 C) (Oral)   Resp 16   Ht 5\' 6"  (1.676 m)   Wt 85.2 kg   LMP 10/11/2022 (Exact Date)   BMI 30.31 kg/m    FHT:  FHR: 120 bpm, variability: moderate,  accelerations: present,  decelerations: occasional variable, rare late decel UC:   Q 2-5 minutes Dilation: 5 Effacement (%): 90 Cervical Position: Posterior Station: -2 Presentation: Vertex Exam by:: V Smith CNM  Labs: Results for orders placed or performed during the hospital encounter of 07/09/23 (from the past 24 hours)  Glucose, capillary     Status: None   Collection Time: 07/09/23  7:50 AM  Result Value Ref Range   Glucose-Capillary 99 70 - 99 mg/dL  CBC     Status: Abnormal   Collection Time: 07/09/23  7:59 AM  Result Value Ref Range   WBC 7.9 4.0 - 10.5 K/uL   RBC 3.75 (L) 3.87 - 5.11 MIL/uL   Hemoglobin 9.9 (L) 12.0 - 15.0 g/dL   HCT 14.7 (L) 82.9 - 56.2 %   MCV 81.1 80.0 - 100.0 fL   MCH 26.4 26.0 - 34.0 pg   MCHC 32.6 30.0 - 36.0 g/dL   RDW 13.0 86.5 - 78.4 %   Platelets 214 150 - 400 K/uL   nRBC 0.0 0.0 - 0.2 %  Type and screen     Status: None   Collection Time: 07/09/23  7:59 AM  Result Value Ref Range   ABO/RH(D) B POS    Antibody Screen NEG    Sample Expiration      07/12/2023,2359 Performed at [facility], [address] [place], [place] [address]   RPR     Status: None   Collection Time: 07/09/23  7:59 AM  Result Value Ref Range   RPR Ser Ql NON REACTIVE NON REACTIVE  Glucose, capillary     Status: None   Collection Time: 07/09/23 11:53 AM  Result Value Ref Range   Glucose-Capillary 77 70 - 99 mg/dL  Glucose, capillary     Status: None   Collection Time: 07/09/23  5:15 PM  Result Value Ref Range    Glucose-Capillary 82 70 - 99 mg/dL    Assessment / Plan: [redacted]w[redacted]d week IUP ROM x Hours: 10 Minutes: 46 Labor: good cervical change, continue pitocin Fetal Wellbeing: Category 2- occasional variable and rare late decels, reassured by moderate variability and accel with fetal scalp stimulation during exam Pain Control: epidural Anticipated MOD: vaginal  Fonda Hymen., SNM 07/09/2023 7:47 PM

## 2023-07-09 NOTE — H&P (Signed)
 HPI: April Boone is a 38 y.o. year old G40P3014 female at [redacted]w[redacted]d weeks gestation who presents to L&D for IOL for A1GDM. Denies LOF, VB. +FM.   NURSING  PROVIDER  Office Location [facility] Dating by LMP c/w U/S at 8.4 wks  [facility] Model Traditional Anatomy U/S EIF, marginal cord  Initiated care at  [employer]  English and Hausa              LAB RESULTS   Support Person  FOB Genetics NIPS: low risk female AFP: negative    NT/IT (FT only)     Carrier Screen Horizon:   Rhogam  B/Positive/-- (11/07 1038) A1C/GTT Early:  Third trimester: Dx GDM  Flu Vaccine     TDaP Vaccine  06/15/23 Blood Type B/Positive/-- (11/07 1038)  Covid Vaccine  Yes Antibody Negative (11/07 1038)  RSV Vaccine  Rubella 3.17 (11/07 1038)  Feeding Plan breast RPR Non Reactive (03/07 4403)  Contraception No  method  HBsAg Negative (11/07 1038)  Circumcision Yes if female HIV Non Reactive (03/07 0838)  Pediatrician   List given  HCVAb Non Reactive (11/07 1038)  Prenatal Classes       Pap Diagnosis  Date Value Ref Range Status  02/10/2023   Final   - Negative for intraepithelial lesion or malignancy (NILM)    BTL Consent  GC/CT Initial:   36wks:    VBAC Consent  GBS   For PCN allergy, check sensitivities        DME Rx [X]  BP cuff [ ]  Weight Scale Waterbirth  [ ]  Class [ ]  Consent [ ]  CNM visit  PHQ9 & GAD7 [X]  new OB [ x ] 28 weeks  [  ] 36 weeks Induction  [ ]  Orders Entered [ ] Foley Y/N     OB History     Gravida  5   Para  3   Term  3   Preterm      AB  1   Living  4      SAB  1   IAB      Ectopic      Multiple  1   Live Births  4          Past Medical History:  Diagnosis Date   Gestational diabetes    Obese    Past Surgical History:  Procedure Laterality Date   NO PAST SURGERIES     Family History: family history includes Healthy in her father and mother. Social History:  reports that she has never smoked. She has never used smokeless tobacco. She reports  that she does not currently use alcohol. She reports that she does not currently use drugs.     Maternal Diabetes: Yes:  Diabetes Type:  Diet controlled Genetic Screening: Normal Maternal Ultrasounds/Referrals: Isolated EIF (echogenic intracardiac focus) Fetal Ultrasounds or other Referrals:  Referred to Materal Fetal Medicine  Maternal Substance Abuse:  No Significant Maternal Medications:  None Significant Maternal Lab Results:  Group B Strep positive Number of Prenatal Visits:greater than 3 verified prenatal visits Maternal Vaccinations:TDap Other Comments:  None  Review of Systems  Constitutional:  Negative for chills and fever.  Eyes:  Negative for visual disturbance.  Gastrointestinal:  Positive for abdominal pain. Negative for nausea and vomiting.  Genitourinary:  Negative for vaginal bleeding and vaginal discharge.  Neurological:  Negative for headaches.   Maternal Medical History:  Reason  for admission: Nausea. IOL  Contractions: Frequency: rare.   Perceived severity is mild.   Fetal activity: Perceived fetal activity is normal.   Prenatal complications: No PIH or IUGR.   Prenatal Complications - Diabetes: gestational. Diabetes is managed by diet.     Dilation: 1 Effacement (%): Thick Station: -2 Exam by:: Felipe Horton, CNM Blood pressure 110/70, pulse (!) 116, temperature 97.9 F (36.6 C), temperature source Oral, resp. rate 16, height 5\' 6"  (1.676 m), weight 85.2 kg, last menstrual period 10/11/2022. Maternal Exam:  Uterine Assessment: Contraction strength is mild.  Contraction frequency is rare.  Abdomen: Patient reports no abdominal tenderness. Estimated fetal weight is 7-12.   Fetal presentation: vertex Introitus: Normal vulva. Vulva is negative for lesion.  Vagina is negative for discharge.  Amniotic fluid character: clear. Pelvis: adequate for delivery.   Cervix: Cervix evaluated by digital exam.     Fetal Exam Fetal Monitor Review: Baseline rate: 135.   Variability: moderate (6-25 bpm).   Pattern: accelerations present and no decelerations.   Fetal State Assessment: Category I - tracings are normal.   Physical Exam Constitutional:      Appearance: She is well-developed.  HENT:     Head: Normocephalic.  Eyes:     Conjunctiva/sclera: Conjunctivae normal.  Cardiovascular:     Rate and Rhythm: Normal rate.  Pulmonary:     Effort: Pulmonary effort is normal. No respiratory distress.  Abdominal:     Palpations: Abdomen is soft.     Tenderness: There is no abdominal tenderness.  Genitourinary:    General: Normal vulva.  Vulva is no lesion.     Vagina: No vaginal discharge or bleeding.  Musculoskeletal:        General: Normal range of motion.     Cervical back: Normal range of motion and neck supple.     Right lower leg: No edema.     Left lower leg: No edema.  Skin:    General: Skin is warm and dry.  Neurological:     Mental Status: She is alert and oriented to person, place, and time.  Psychiatric:        Mood and Affect: Mood normal.     Prenatal labs: ABO, Rh: --/--/B POS (05/09 0759) Antibody: NEG (05/09 0759) Rubella: 3.17 (11/07 1038) RPR: Non Reactive (03/07 0838)  HBsAg: Negative (11/07 1038)  HIV: Non Reactive (03/07 9604)  GBS: Positive/-- (03/17 0000)   Assessment: 1. Labor: IOL for A1GDM 2. Fetal Wellbeing: Category I  3. Pain Control: Comfort measures 4. GBS: pos 5. 37.2 week IUP 6. A1GDM 7. AMA  Plan:  1. Admit to BS per consult with MD 2. Routine L&D orders 3. Analgesia/anesthesia PRN  4. Dual Cytotec. Consider foley bulb 5. PCN 6. CBGS Q4   Cleston Lautner 07/09/2023, 11:00 AM

## 2023-07-09 NOTE — Progress Notes (Signed)
 After multiple requests by the patient and multiple adjustments the Toco was removed at 2249. Pitocin not restarted at this time. Dr Hubert Madden aware.

## 2023-07-09 NOTE — Progress Notes (Signed)
 Labor Progress Note April Boone is a 38 y.o. W4X3244 at [redacted]w[redacted]d presented for IOL due to ?A1GDM  S: In to eval pt -- having prolonged decel. RN team in room doing positon changes, FHR in 120s. Pt denies increased pressure.  O:  BP 108/79   Pulse 94   Temp 98.2 F (36.8 C) (Oral)   Resp 16   Ht 5\' 6"  (1.676 m)   Wt 85.2 kg   LMP 10/11/2022 (Exact Date)   BMI 30.31 kg/m  EFM: 125/mod/+a/+early, variables  prolonged as detailed above  CVE: Dilation: 7.5 Effacement (%): 90 Cervical Position: Posterior Station: -2 Presentation: Vertex Exam by:: Pressley Brome CNM   A&P: 38 y.o. W1U2725 [redacted]w[redacted]d here for IOL due A1GDM.  #Labor: Progressing well. Cx now 7.5cm. Pit paused. Terbutaline given to allow for fetal recovery.  #Pain: Epidural #FWB: Cat II, overall reassuring #GBS positive, on PCN  #A1GDM: CBGs ok  EFW 2891g (62%tile), AC 55%tile   #Marginal cord insertion   Melanie Spires, MD 9:52 PM

## 2023-07-09 NOTE — Anesthesia Preprocedure Evaluation (Signed)
 Anesthesia Evaluation  Patient identified by MRN, date of birth, ID band Patient awake    Reviewed: Allergy & Precautions, H&P , NPO status , Patient's Chart, lab work & pertinent test results, reviewed documented beta blocker date and time   Airway Mallampati: III  TM Distance: >3 FB Neck ROM: full    Dental no notable dental hx. (+) Dental Advisory Given,    Pulmonary neg pulmonary ROS   Pulmonary exam normal breath sounds clear to auscultation       Cardiovascular negative cardio ROS Normal cardiovascular exam Rhythm:regular Rate:Normal     Neuro/Psych negative neurological ROS  negative psych ROS   GI/Hepatic negative GI ROS, Neg liver ROS,,,  Endo/Other  negative endocrine ROSdiabetes, Gestational    Renal/GU negative Renal ROS  negative genitourinary   Musculoskeletal   Abdominal   Peds  Hematology negative hematology ROS (+)   Anesthesia Other Findings   Reproductive/Obstetrics (+) Pregnancy                             Anesthesia Physical Anesthesia Plan  ASA: 2  Anesthesia Plan: Epidural   Post-op Pain Management: Minimal or no pain anticipated   Induction: Intravenous  PONV Risk Score and Plan: 2 and Treatment may vary due to age or medical condition  Airway Management Planned: Natural Airway  Additional Equipment: Fetal Monitoring  Intra-op Plan:   Post-operative Plan:   Informed Consent: I have reviewed the patients History and Physical, chart, labs and discussed the procedure including the risks, benefits and alternatives for the proposed anesthesia with the patient or authorized representative who has indicated his/her understanding and acceptance.     Dental Advisory Given  Plan Discussed with: Anesthesiologist and CRNA  Anesthesia Plan Comments: (Labs checked- platelets confirmed with RN in room. Fetal heart tracing, per RN, reported to be stable enough  for sitting procedure. Discussed epidural, and patient consents to the procedure:  included risk of possible headache,backache, failed block, allergic reaction, and nerve injury. This patient was asked if she had any questions or concerns before the procedure started.)       Anesthesia Quick Evaluation

## 2023-07-09 NOTE — Anesthesia Procedure Notes (Signed)
 Epidural Patient location during procedure: OB Start time: 07/09/2023 4:49 PM End time: 07/09/2023 5:00 PM  Staffing Anesthesiologist: Rhenda Cedars, MD  Preanesthetic Checklist Completed: patient identified, IV checked, site marked, risks and benefits discussed, surgical consent, monitors and equipment checked, pre-op evaluation and timeout performed  Epidural Patient position: sitting Prep: DuraPrep and site prepped and draped Patient monitoring: continuous pulse ox and blood pressure Approach: midline Location: L3-L4 Injection technique: LOR air  Needle:  Needle type: Tuohy  Needle gauge: 17 G Needle length: 9 cm and 9 Needle insertion depth: 8 cm Catheter type: closed end flexible Catheter size: 19 Gauge Catheter at skin depth: 13 cm Test dose: negative  Assessment Events: blood not aspirated, no cerebrospinal fluid, injection not painful, no injection resistance, no paresthesia and negative IV test

## 2023-07-09 NOTE — Plan of Care (Signed)
  Problem: Education: Goal: Knowledge of General Education information will improve Description: Including pain rating scale, medication(s)/side effects and non-pharmacologic comfort measures Outcome: Progressing   Problem: Health Behavior/Discharge Planning: Goal: Ability to manage health-related needs will improve Outcome: Progressing   Problem: Clinical Measurements: Goal: Ability to maintain clinical measurements within normal limits will improve Outcome: Progressing Goal: Will remain free from infection Outcome: Progressing Goal: Diagnostic test results will improve Outcome: Progressing Goal: Respiratory complications will improve Outcome: Progressing Goal: Cardiovascular complication will be avoided Outcome: Progressing   Problem: Activity: Goal: Risk for activity intolerance will decrease Outcome: Progressing   Problem: Nutrition: Goal: Adequate nutrition will be maintained Outcome: Progressing   Problem: Coping: Goal: Level of anxiety will decrease Outcome: Progressing   Problem: Elimination: Goal: Will not experience complications related to bowel motility Outcome: Progressing Goal: Will not experience complications related to urinary retention Outcome: Progressing   Problem: Pain Managment: Goal: General experience of comfort will improve and/or be controlled Outcome: Progressing   Problem: Safety: Goal: Ability to remain free from injury will improve Outcome: Progressing   Problem: Skin Integrity: Goal: Risk for impaired skin integrity will decrease Outcome: Progressing   Problem: Education: Goal: Knowledge of Childbirth will improve Outcome: Progressing Goal: Ability to make informed decisions regarding treatment and plan of care will improve Outcome: Progressing Goal: Ability to state and carry out methods to decrease the pain will improve Outcome: Progressing Goal: Individualized Educational Video(s) Outcome: Progressing   Problem:  Coping: Goal: Ability to verbalize concerns and feelings about labor and delivery will improve Outcome: Progressing   Problem: Life Cycle: Goal: Ability to make normal progression through stages of labor will improve Outcome: Progressing Goal: Ability to effectively push during vaginal delivery will improve Outcome: Progressing   Problem: Role Relationship: Goal: Will demonstrate positive interactions with the child Outcome: Progressing   Problem: Safety: Goal: Risk of complications during labor and delivery will decrease Outcome: Progressing   Problem: Pain Management: Goal: Relief or control of pain from uterine contractions will improve Outcome: Progressing   Problem: Education: Goal: Ability to describe self-care measures that may prevent or decrease complications (Diabetes Survival Skills Education) will improve Outcome: Progressing Goal: Individualized Educational Video(s) Outcome: Progressing   Problem: Coping: Goal: Ability to adjust to condition or change in health will improve Outcome: Progressing   Problem: Fluid Volume: Goal: Ability to maintain a balanced intake and output will improve Outcome: Progressing   Problem: Health Behavior/Discharge Planning: Goal: Ability to identify and utilize available resources and services will improve Outcome: Progressing Goal: Ability to manage health-related needs will improve Outcome: Progressing   Problem: Metabolic: Goal: Ability to maintain appropriate glucose levels will improve Outcome: Progressing   Problem: Nutritional: Goal: Maintenance of adequate nutrition will improve Outcome: Progressing Goal: Progress toward achieving an optimal weight will improve Outcome: Progressing   Problem: Skin Integrity: Goal: Risk for impaired skin integrity will decrease Outcome: Progressing   Problem: Tissue Perfusion: Goal: Adequacy of tissue perfusion will improve Outcome: Progressing

## 2023-07-09 NOTE — Progress Notes (Signed)
 April Boone is a 38 y.o. Z6X0960 at [redacted]w[redacted]d.  Subjective: Pt requesting epidural.   Objective: BP 92/61   Pulse (!) 104   Temp 97.9 F (36.6 C) (Oral)   Resp 16   Ht 5\' 6"  (1.676 m)   Wt 85.2 kg   LMP 10/11/2022 (Exact Date)   BMI 30.31 kg/m    FHT:  FHR: 125 bpm, variability: mod,  accelerations:  15x15,  decelerations:  few variables UC:   Q 2-6 minutes, mod Dilation: 2 Effacement (%): 70 Cervical Position: Posterior Station: -2 Presentation: Vertex Exam by:: Marney Sinning, RN  Labs: Results for orders placed or performed during the hospital encounter of 07/09/23 (from the past 24 hours)  Glucose, capillary     Status: None   Collection Time: 07/09/23  7:50 AM  Result Value Ref Range   Glucose-Capillary 99 70 - 99 mg/dL  CBC     Status: Abnormal   Collection Time: 07/09/23  7:59 AM  Result Value Ref Range   WBC 7.9 4.0 - 10.5 K/uL   RBC 3.75 (L) 3.87 - 5.11 MIL/uL   Hemoglobin 9.9 (L) 12.0 - 15.0 g/dL   HCT 45.4 (L) 09.8 - 11.9 %   MCV 81.1 80.0 - 100.0 fL   MCH 26.4 26.0 - 34.0 pg   MCHC 32.6 30.0 - 36.0 g/dL   RDW 14.7 82.9 - 56.2 %   Platelets 214 150 - 400 K/uL   nRBC 0.0 0.0 - 0.2 %  Type and screen     Status: None   Collection Time: 07/09/23  7:59 AM  Result Value Ref Range   ABO/RH(D) B POS    Antibody Screen NEG    Sample Expiration      07/12/2023,2359 Performed at [facility], [address], [place], [place] [address]   RPR     Status: None   Collection Time: 07/09/23  7:59 AM  Result Value Ref Range   RPR Ser Ql NON REACTIVE NON REACTIVE  Glucose, capillary     Status: None   Collection Time: 07/09/23 11:53 AM  Result Value Ref Range   Glucose-Capillary 77 70 - 99 mg/dL    Assessment / Plan: [redacted]w[redacted]d week IUP ROM x Hours: 7 Minutes: 47 Labor: Early, progressing  Fetal Wellbeing:  Category I-II, overall reassuring w/ mod variability and accels,  Pain Control:  comfort measures Anticipated MOD:  SVD A1GDM: CBGs Nml w/ out  meds  Felipe Horton April Boone , CNM 07/09/2023 4:48 PM

## 2023-07-09 NOTE — Progress Notes (Signed)
 April Boone is a 38 y.o. Z6X0960 at [redacted]w[redacted]d.  Subjective: CTBS for prolonged decel. Pt reports mild cramping   Objective: BP 110/70   Pulse (!) 116   Temp 97.9 F (36.6 C) (Oral)   Resp 16   Ht 5\' 6"  (1.676 m)   Wt 85.2 kg   LMP 10/11/2022 (Exact Date)   BMI 30.31 kg/m    FHT:  FHR: 135 bpm, variability: mod,  accelerations:  15x15,  decelerations:  6 minute prolonged decel, now recovered w/ interventions.  UC:   irreg, mild Dilation: 1 Effacement (%): Thick Cervical Position: Posterior Station: -2 Presentation: Vertex Exam by:: Felipe Horton, CNM FSE placed without difficulty. Leaking scant clear fluid.   Labs: Results for orders placed or performed during the hospital encounter of 07/09/23 (from the past 24 hours)  Glucose, capillary     Status: None   Collection Time: 07/09/23  7:50 AM  Result Value Ref Range   Glucose-Capillary 99 70 - 99 mg/dL    Assessment / Plan: [redacted]w[redacted]d week IUP Labor: IOL for A1GDM. Dual cytotec given <30 min prior to decel. Vaginal cytotec removed during decel and placement of FSE.   Fetal Wellbeing:  Category I-II, recovered well after prolonged decel. Will CTO closely. Dr. Donetta Furl called to [facility] for decel. Reviewed tracing. Agrees w/ POC Pain Control:  Comfort measures Anticipated MOD:  Uncertain. Appropriate to continue IOL at this time. Discussed with pt that if he has longer or recurrent decels, C/S would be indicated. Pt verbalized understanding and gives verbal consent.   A1GDM: CBGS Q4 hours GBS pos: PCN Language Barrier: Attempted to get interpreter x 3. Unable to do so. Pt understands most English. Able to respond to open-ended questions appropriately.   Felipe Horton, Barbie Croston , CNM 07/09/2023 9:31 AM

## 2023-07-10 ENCOUNTER — Encounter (HOSPITAL_COMMUNITY): Payer: Self-pay | Admitting: Family Medicine

## 2023-07-10 DIAGNOSIS — O99820 Streptococcus B carrier state complicating pregnancy: Secondary | ICD-10-CM | POA: Diagnosis not present

## 2023-07-10 DIAGNOSIS — O99214 Obesity complicating childbirth: Secondary | ICD-10-CM | POA: Diagnosis not present

## 2023-07-10 DIAGNOSIS — O4423 Partial placenta previa NOS or without hemorrhage, third trimester: Secondary | ICD-10-CM

## 2023-07-10 DIAGNOSIS — O09523 Supervision of elderly multigravida, third trimester: Secondary | ICD-10-CM | POA: Diagnosis not present

## 2023-07-10 DIAGNOSIS — Z3A37 37 weeks gestation of pregnancy: Secondary | ICD-10-CM

## 2023-07-10 DIAGNOSIS — O24420 Gestational diabetes mellitus in childbirth, diet controlled: Secondary | ICD-10-CM | POA: Diagnosis not present

## 2023-07-10 LAB — GLUCOSE, CAPILLARY
Glucose-Capillary: 121 mg/dL — ABNORMAL HIGH (ref 70–99)
Glucose-Capillary: 106 mg/dL — ABNORMAL HIGH (ref 70–99)
Glucose-Capillary: 107 mg/dL — ABNORMAL HIGH (ref 70–99)

## 2023-07-10 MED ORDER — SODIUM CHLORIDE 0.9 % IV SOLN: 250.0000 mL | INTRAVENOUS | Status: DC | PRN

## 2023-07-10 MED ORDER — LACTATED RINGERS AMNIOINFUSION: INTRAVENOUS | Status: DC

## 2023-07-10 MED ORDER — DIPHENHYDRAMINE HCL 25 MG PO CAPS: 25.0000 mg | ORAL_CAPSULE | Freq: Four times a day (QID) | ORAL | Status: DC | PRN

## 2023-07-10 MED ORDER — SODIUM CHLORIDE 0.9% FLUSH: 3.0000 mL | INTRAVENOUS | Status: DC | PRN

## 2023-07-10 MED ORDER — ONDANSETRON HCL 4 MG/2ML IJ SOLN: 4.0000 mg | INTRAMUSCULAR | Status: DC | PRN

## 2023-07-10 MED ORDER — DIBUCAINE (PERIANAL) 1 % EX OINT: 1.0000 | TOPICAL_OINTMENT | CUTANEOUS | Status: DC | PRN

## 2023-07-10 MED ORDER — IBUPROFEN 600 MG PO TABS
600.0000 mg | ORAL_TABLET | Freq: Four times a day (QID) | ORAL | Status: DC
  Administered 2023-07-10 – 2023-07-11 (×6): 600 mg via ORAL
  Filled 2023-07-10 (×5): qty 1

## 2023-07-10 MED ORDER — OXYTOCIN-SODIUM CHLORIDE 30-0.9 UT/500ML-% IV SOLN
1.0000 m[IU]/min | INTRAVENOUS | Status: DC
Start: 2023-07-10 — End: 2023-07-10

## 2023-07-10 MED ORDER — ACETAMINOPHEN 500 MG PO TABS
1000.0000 mg | ORAL_TABLET | Freq: Four times a day (QID) | ORAL | Status: DC | PRN
  Administered 2023-07-10 – 2023-07-11 (×3): 1000 mg via ORAL
  Filled 2023-07-10 (×3): qty 2

## 2023-07-10 MED ORDER — LACTATED RINGERS IV SOLN
500.0000 mL | INTRAVENOUS | Status: DC | PRN
  Administered 2023-07-10: 1000 mL via INTRAVENOUS

## 2023-07-10 MED ORDER — LACTATED RINGERS IV SOLN: INTRAVENOUS | Status: DC

## 2023-07-10 MED ORDER — COCONUT OIL OIL: 1.0000 | TOPICAL_OIL | Status: DC | PRN

## 2023-07-10 MED ORDER — ACETAMINOPHEN 325 MG PO TABS: 650.0000 mg | ORAL_TABLET | ORAL | Status: DC | PRN

## 2023-07-10 MED ORDER — ONDANSETRON HCL 4 MG PO TABS: 4.0000 mg | ORAL_TABLET | ORAL | Status: DC | PRN

## 2023-07-10 MED ORDER — PRENATAL MULTIVITAMIN CH
1.0000 | ORAL_TABLET | Freq: Every day | ORAL | Status: DC
  Administered 2023-07-11: 1 via ORAL
  Filled 2023-07-10: qty 1

## 2023-07-10 MED ORDER — BENZOCAINE-MENTHOL 20-0.5 % EX AERO
1.0000 | INHALATION_SPRAY | CUTANEOUS | Status: DC | PRN
  Administered 2023-07-10: 1 via TOPICAL

## 2023-07-10 MED ORDER — SODIUM CHLORIDE 0.9% FLUSH: 3.0000 mL | Freq: Two times a day (BID) | INTRAVENOUS | Status: DC

## 2023-07-10 MED ORDER — SIMETHICONE 80 MG PO CHEW: 80.0000 mg | CHEWABLE_TABLET | ORAL | Status: DC | PRN

## 2023-07-10 MED ORDER — ZOLPIDEM TARTRATE 5 MG PO TABS: 5.0000 mg | ORAL_TABLET | Freq: Every evening | ORAL | Status: DC | PRN

## 2023-07-10 MED ORDER — WITCH HAZEL-GLYCERIN EX PADS: 1.0000 | MEDICATED_PAD | CUTANEOUS | Status: DC | PRN

## 2023-07-10 MED ORDER — SENNOSIDES-DOCUSATE SODIUM 8.6-50 MG PO TABS
2.0000 | ORAL_TABLET | ORAL | Status: DC
  Administered 2023-07-10: 2 via ORAL
  Filled 2023-07-10: qty 2

## 2023-07-10 NOTE — Discharge Summary (Signed)
 Postpartum Discharge Summary  Date of Service updated***     Patient Name: April Boone DOB: 1985-06-05 MRN: [id number]  Date of admission: 07/09/2023 Delivery date:07/10/2023 Delivering provider: Jan Mcgill Date of discharge: 07/10/2023  Admitting diagnosis: Gestational diabetes mellitus (GDM) [O24.419] Intrauterine pregnancy: [redacted]w[redacted]d     Secondary diagnosis:  Principal Problem:   Gestational diabetes mellitus (GDM)  Additional problems: ***    Discharge diagnosis: Term Pregnancy Delivered and VAVD                                              Post partum procedures:{Postpartum procedures:23558} Augmentation: AROM, Pitocin, and Cytotec Complications: Non-reassuring fetal status requiring VAVD   Hospital course: Induction of Labor With Vaginal Delivery   38 y.o. yo Q6V7846 at [redacted]w[redacted]d was admitted to the hospital 07/09/2023 for induction of labor.  Indication for induction: poorly controlled GDM.  Patient had an labor course complicated by prolong deceleration shortly after vaginal cytotec requiring removal.  Another prolong deceleration requiring short pitocin break.  She then developed decelerations into the 60s with pushing and poor maternal effort necessitating vacuum assisted vaginal delivery w/o complications.  Membrane Rupture Time/Date: 9:01 AM,07/09/2023  Delivery Method:Vaginal, Vacuum (Extractor) Operative Delivery:Device used:Soft Bell Mighty Vac Indication: Maternal exhaustion and Fetal indications Episiotomy: None Lacerations:  None Details of delivery can be found in separate delivery note.  Patient had a postpartum course complicated by***. Patient is discharged home 07/10/23.  Newborn Data: Birth date:07/10/2023 Birth time:10:39 AM Gender:Female Living status:Living Apgars:8 ,9  Weight:3110 g  Magnesium Sulfate received: {Mag received:30440022} BMZ received: No Rhophylac:N/A MMR:N/A T-DaP:Given prenatally Flu: No RSV Vaccine received:  No Transfusion:{Transfusion received:30440034}  Immunizations received: Immunization History  Administered Date(s) Administered   Tdap 06/15/2023    Physical exam  Vitals:   07/10/23 1045 07/10/23 1055 07/10/23 1100 07/10/23 1119  BP:  (!) 127/99 133/83 (!) 123/59  Pulse:  (!) 103 91 93  Resp:    17  Temp:      TempSrc:      SpO2: 100%     Weight:      Height:       General: {Exam; general:21111117} Lochia: {Desc; appropriate/inappropriate:30686::"appropriate"} Uterine Fundus: {Desc; firm/soft:30687} Incision: {Exam; incision:21111123} DVT Evaluation: {Exam; dvt:2111122} Labs: Lab Results  Component Value Date   WBC 7.9 07/09/2023   HGB 9.9 (L) 07/09/2023   HCT 30.4 (L) 07/09/2023   MCV 81.1 07/09/2023   PLT 214 07/09/2023      Latest Ref Rng & Units 10/16/2022   10:20 AM  CMP  Glucose 70 - 99 mg/dL 962   BUN 6 - 20 mg/dL 10   Creatinine 9.52 - 1.00 mg/dL 8.41   Sodium 324 - 401 mmol/L 138   Potassium 3.5 - 5.1 mmol/L 3.7   Chloride 98 - 111 mmol/L 104   CO2 22 - 32 mmol/L 26   Calcium  8.9 - 10.3 mg/dL 9.3   Total Protein 6.5 - 8.1 g/dL 7.7   Total Bilirubin 0.3 - 1.2 mg/dL 0.2   Alkaline Phos 38 - 126 U/L 44   AST 15 - 41 U/L 14   ALT 0 - 44 U/L 12    Edinburgh Score:     No data to display         No data recorded  After visit meds:  Allergies as of 07/10/2023  No Known Allergies   Med Rec must be completed prior to using this [facility]***        Discharge home in stable condition Infant Feeding: {Baby feeding:23562} Infant Disposition:{CHL IP OB HOME WITH ZOXWRU:04540} Discharge instruction: per After Visit Summary and Postpartum booklet. Activity: Advance as tolerated. Pelvic rest for 6 weeks.  Diet: {OB JWJX:91478295} Future Appointments:No future appointments. Follow up Visit: Message to [facility] 5/10  Please schedule this patient for a In person postpartum visit in 4 weeks with the following provider: Any provider. Additional  Postpartum F/U:2 hour GTT  High risk pregnancy complicated by: Poorly controlled GDM, AMA Delivery mode:  Vaginal, Vacuum Investment banker, operational) Anticipated Birth Control:  none   07/10/2023 Ebony Goldstein, MD

## 2023-07-10 NOTE — Progress Notes (Signed)
 Called to room for FHR concern RN had tried multiple position changes  IUPC in place, added amnioinfusion   Dilation: Lip/rim Effacement (%): 80 Cervical Position: Posterior Station: 0 Presentation: Vertex Exam by:: Zell Hylton MD   Contractions every 7 minutes, Pitocin at 8 mu Infant responded to scalp stimulation Infant feels OP Placed in in exaggerated sims

## 2023-07-10 NOTE — Lactation Note (Signed)
 This note was copied from a baby's chart. Lactation Consultation Note  Patient Name: April Boone WUJWJ'[id number] Date: 07/10/2023 Age:38 hours Reason for consult: Initial assessment;Early term 37-38.6wks;Maternal endocrine disorder When LC attempted to use the interpreter pad to communicate with MOB in [place], she informed LC that she speaks [place] and wants to communicate in [place] only.  P4- MOB plans to offer both breast milk and formula to infant. MOB reports that she has not placed infant to the breast yet because she was not feeling well. Infant had just had formula 1 hour ago, so LC encouraged MOB to call out for a feeding. Per MOB, she breast fed all of her older children for over a year, but the last child is now 33 years old. MOB would like the [facility] team to show her how to position infant on the breast because it has been a long time since she last breast fed a child.  LC reviewed the first 24 hr birthday nap, day 2 cluster feeding, feeding infant on cue 8-12x in 24 hrs, not allowing infant to go over 3 hrs without a feeding, CDC milk storage guidelines, LC services handout and engorgement/breast care. LC encouraged MOB to call for further assistance as needed. MOB has already received a DEBP through her insurance.  Maternal Data Has patient been taught Hand Expression?: No Does the patient have breastfeeding experience prior to this delivery?: Yes How long did the patient breastfeed?: over 1 year for all over her older children, states that she plans to do the same for this child as well  Feeding Mother's Current Feeding Choice: Breast Milk and Formula  Lactation Tools Discussed/Used Pump Education: Milk Storage  Interventions Interventions: Breast feeding basics reviewed;Education;LC Services brochure  Discharge Discharge Education: Engorgement and breast care;Warning signs for feeding baby Pump: DEBP;Personal  Consult Status Consult Status: Follow-up Date:  07/11/23 Follow-up type: In-patient    Vernette Goo BS, IBCLC 07/10/2023, 4:13 PM

## 2023-07-10 NOTE — Plan of Care (Signed)

## 2023-07-10 NOTE — Progress Notes (Signed)
 In the room for FHR changes-- FHR in the 110s 120s/moderate/+accel, decels with pushing to the 90s Pushed with patient  Increased pitocin to 10 mu Continues to feel OP

## 2023-07-10 NOTE — Progress Notes (Signed)
 Labor progress note:  At bedside to recheck cx. Cx now 8.5/90/-2, more cervix anterior. Cat II tracing, overall reassuring. Anticipate SVD soon.   Melanie Spires, MD OB Fellow, Faculty Practice [facility], Center for [facility]

## 2023-07-10 NOTE — Progress Notes (Signed)
 Labor progress note:  At bedside to reassess pt for restarting Pitocin. Pt uncomfortable with abdominal strap for toco. Offered placement of IUPC and pt accepted. IUPC placed to allow titration of Pitocin. Pt and baby tolerated well. Cat I tracing, will continue to uptitrate Pitocin at this time.   Melanie Spires, MD OB Fellow, Faculty Practice [facility], Center for [facility]

## 2023-07-11 DIAGNOSIS — Z8759 Personal history of other complications of pregnancy, childbirth and the puerperium: Secondary | ICD-10-CM

## 2023-07-11 LAB — CBC
HCT: 26.7 % — ABNORMAL LOW (ref 36.0–46.0)
Hemoglobin: 8.7 g/dL — ABNORMAL LOW (ref 12.0–15.0)
MCH: 26.8 pg (ref 26.0–34.0)
MCHC: 32.6 g/dL (ref 30.0–36.0)
MCV: 82.2 fL (ref 80.0–100.0)
Platelets: 159 10*3/uL (ref 150–400)
RBC: 3.25 MIL/uL — ABNORMAL LOW (ref 3.87–5.11)
RDW: 13.8 % (ref 11.5–15.5)
WBC: 14.4 10*3/uL — ABNORMAL HIGH (ref 4.0–10.5)
nRBC: 0 % (ref 0.0–0.2)

## 2023-07-11 LAB — GLUCOSE, CAPILLARY: Glucose-Capillary: 92 mg/dL (ref 70–99)

## 2023-07-11 MED ORDER — IBUPROFEN 600 MG PO TABS: 600.0000 mg | ORAL_TABLET | Freq: Four times a day (QID) | ORAL | 0 refills | Status: DC

## 2023-07-11 MED ORDER — WITCH HAZEL-GLYCERIN EX PADS: 1.0000 | MEDICATED_PAD | CUTANEOUS | Status: DC | PRN

## 2023-07-11 MED ORDER — OXYCODONE HCL 5 MG PO TABS: 5.0000 mg | ORAL_TABLET | Freq: Once | ORAL | Status: DC

## 2023-07-11 MED ORDER — IBUPROFEN 600 MG PO TABS
600.0000 mg | ORAL_TABLET | Freq: Four times a day (QID) | ORAL | 0 refills | Status: DC
  Filled 2023-07-11: qty 30, 8d supply, fill #0

## 2023-07-11 MED ORDER — BENZOCAINE-MENTHOL 20-0.5 % EX AERO
1.0000 | INHALATION_SPRAY | CUTANEOUS | 0 refills | Status: DC | PRN
  Filled 2023-07-11: qty 56, fill #0

## 2023-07-11 MED ORDER — BENZOCAINE-MENTHOL 20-0.5 % EX AERO: 1.0000 | INHALATION_SPRAY | CUTANEOUS | Status: DC | PRN

## 2023-07-11 MED ORDER — OXYCODONE HCL 5 MG PO TABS: 5.0000 mg | ORAL_TABLET | Freq: Four times a day (QID) | ORAL | Status: DC | PRN

## 2023-07-11 MED ORDER — DIBUCAINE (PERIANAL) 1 % EX OINT: 1.0000 | TOPICAL_OINTMENT | CUTANEOUS | Status: DC | PRN

## 2023-07-11 MED ORDER — ACETAMINOPHEN 500 MG PO TABS
1000.0000 mg | ORAL_TABLET | Freq: Four times a day (QID) | ORAL | 0 refills | Status: DC | PRN
  Filled 2023-07-11: qty 30, 4d supply, fill #0

## 2023-07-11 MED ORDER — WITCH HAZEL-GLYCERIN EX PADS
1.0000 | MEDICATED_PAD | CUTANEOUS | 12 refills | Status: DC | PRN
  Filled 2023-07-11: qty 40, fill #0

## 2023-07-11 MED ORDER — DIBUCAINE (PERIANAL) 1 % EX OINT
1.0000 | TOPICAL_OINTMENT | CUTANEOUS | 0 refills | Status: DC | PRN
  Filled 2023-07-11: qty 28, fill #0

## 2023-07-11 MED ORDER — ACETAMINOPHEN 500 MG PO TABS: 1000.0000 mg | ORAL_TABLET | Freq: Once | ORAL | Status: DC

## 2023-07-11 MED ORDER — ACETAMINOPHEN 500 MG PO TABS: 1000.0000 mg | ORAL_TABLET | Freq: Four times a day (QID) | ORAL | 0 refills | Status: AC | PRN

## 2023-07-11 NOTE — Anesthesia Postprocedure Evaluation (Signed)
 Anesthesia Post Note  Patient: April Boone  Procedure(s) Performed: AN AD HOC LABOR EPIDURAL     Patient location during evaluation: Mother Baby Anesthesia Type: Epidural Level of consciousness: awake and alert Pain management: pain level controlled Vital Signs Assessment: post-procedure vital signs reviewed and stable Respiratory status: spontaneous breathing, nonlabored ventilation and respiratory function stable Cardiovascular status: stable Postop Assessment: no headache, no backache, epidural receding, no apparent nausea or vomiting, patient able to bend at knees, able to ambulate and adequate PO intake Anesthetic complications: no   No notable events documented.  Last Vitals:  Vitals:   07/10/23 2045 07/11/23 0516  BP: 110/71 115/72  Pulse: 93 88  Resp: 18 18  Temp: 36.8 C 36.7 C  SpO2: 100% 99%    Last Pain:  Vitals:   07/11/23 0749  TempSrc:   PainSc: 8    Pain Goal: Patients Stated Pain Goal: 3 (07/10/23 1345)              Epidural/Spinal Function Cutaneous sensation: Normal sensation (07/11/23 0749), Patient able to flex knees: Yes (07/11/23 0749), Patient able to lift hips off bed: Yes (07/11/23 0749), Back pain beyond tenderness at insertion site: No (07/11/23 0749), Progressively worsening motor and/or sensory loss: No (07/11/23 0749), Bowel and/or bladder incontinence post epidural: No (07/11/23 0749)  April Boone

## 2023-07-11 NOTE — Lactation Note (Signed)
 This note was copied from a baby's chart. Lactation Consultation Note  Patient Name: April Boone Date: 07/11/2023 Age:38 hours Reason for consult: Multiple gestation;Follow-up assessment;Early term 37-38.6wks (2 % weight loss, Early D/C. Per mom did not need and interpreter, speaks English) LC reviewed supply and demand,importance of breast stimulation to establish milk supply. Per mom plans to breast feed.  LC provided a hand pump and checked the flange size.  LC also provided the handouts for [facility] resources and storage of breastmilk.   Maternal Data    Feeding Mother's Current Feeding Choice: Breast Milk and Formula Nipple Type: Slow - flow    Lactation Tools Discussed/Used Tools: Pump;Flanges Flange Size: 21 Breast pump type: Manual Pump Education: Setup, frequency, and cleaning;Milk Storage Reason for Pumping: PRN  Interventions Interventions: Breast feeding basics reviewed;Hand pump;Education;LC Services brochure;CDC milk storage guidelines;CDC Guidelines for Breast Pump Cleaning  Discharge Discharge Education: Engorgement and breast care;Warning signs for feeding baby Pump: Personal;DEBP;Manual  Consult Status Consult Status: Complete Date: 07/11/23    Richarda Chance 07/11/2023, 6:31 PM

## 2023-07-11 NOTE — Progress Notes (Addendum)
 POSTPARTUM PROGRESS NOTE  Subjective: April Boone is a 38 y.o. [id number] s/p VAVD at [redacted]w[redacted]d.  She reports she doing well. No acute events overnight. She denies any problems with ambulating, voiding or po intake. Denies nausea or vomiting. She has passed flatus. Pain is well controlled.  Lochia is appropriate.  Objective: Blood pressure 110/71, pulse 93, temperature 98.2 F (36.8 C), temperature source Oral, resp. rate 18, height 5\' 6"  (1.676 m), weight 85.2 kg, last menstrual period 10/11/2022, SpO2 100%, unknown if currently breastfeeding.  Physical Exam:  General: alert, cooperative and no distress Chest: no respiratory distress Abdomen: soft, non-tender  Uterine Fundus: firm and at level of umbilicus Extremities: No calf swelling or tenderness  no edema  Recent Labs    07/09/23 0759 07/11/23 0436  HGB 9.9* 8.7*  HCT 30.4* 26.7*    Assessment/Plan: April Boone is a 38 y.o. [id number] s/p VAVD at [redacted]w[redacted]d  Routine Postpartum Care: Doing well, pain well-controlled.  -- Continue routine care, lactation support  -- Contraception: none -- Feeding: breast  Dispo: Plan for discharge 5/11 or 5/12.  Rayma Calandra, DO Faculty Practice, Center for [facility] 07/11/2023 5:39 AM   GME ATTESTATION:  Evaluation and management procedures were performed by the [facility] Medicine Resident under my supervision. I was immediately available for direct supervision, assistance and direction throughout this encounter.  I also confirm that I have verified the information documented in the resident's note, and that I have also personally reperformed the pertinent components of the physical exam and all of the medical decision making activities.  I have also made any necessary editorial changes.  Darrow End, MD OB Fellow, Faculty [facility], Center for [facility] 07/11/2023 10:13 AM

## 2023-07-12 ENCOUNTER — Other Ambulatory Visit (HOSPITAL_COMMUNITY): Payer: Self-pay

## 2023-07-18 ENCOUNTER — Emergency Department (HOSPITAL_COMMUNITY)

## 2023-07-18 ENCOUNTER — Encounter (HOSPITAL_COMMUNITY): Payer: Self-pay

## 2023-07-18 ENCOUNTER — Other Ambulatory Visit: Payer: Self-pay

## 2023-07-18 ENCOUNTER — Inpatient Hospital Stay (HOSPITAL_COMMUNITY)
Admission: EM | Admit: 2023-07-18 | Discharge: 2023-07-21 | DRG: 776 | Disposition: A | Discharge status: Home / Self Care | Attending: Family Medicine | Admitting: Family Medicine

## 2023-07-18 DIAGNOSIS — O1495 Unspecified pre-eclampsia, complicating the puerperium: Principal | ICD-10-CM | POA: Diagnosis present

## 2023-07-18 DIAGNOSIS — Z3A37 37 weeks gestation of pregnancy: Secondary | ICD-10-CM | POA: Diagnosis not present

## 2023-07-18 DIAGNOSIS — I501 Left ventricular failure: Secondary | ICD-10-CM

## 2023-07-18 DIAGNOSIS — R0601 Orthopnea: Secondary | ICD-10-CM

## 2023-07-18 DIAGNOSIS — I5089 Other heart failure: Secondary | ICD-10-CM

## 2023-07-18 DIAGNOSIS — I5031 Acute diastolic (congestive) heart failure: Secondary | ICD-10-CM | POA: Diagnosis present

## 2023-07-18 DIAGNOSIS — I10 Essential (primary) hypertension: Secondary | ICD-10-CM | POA: Diagnosis not present

## 2023-07-18 DIAGNOSIS — O99285 Endocrine, nutritional and metabolic diseases complicating the puerperium: Secondary | ICD-10-CM | POA: Diagnosis present

## 2023-07-18 DIAGNOSIS — Z79899 Other long term (current) drug therapy: Secondary | ICD-10-CM | POA: Diagnosis not present

## 2023-07-18 DIAGNOSIS — M7989 Other specified soft tissue disorders: Secondary | ICD-10-CM

## 2023-07-18 DIAGNOSIS — R0602 Shortness of breath: Secondary | ICD-10-CM

## 2023-07-18 DIAGNOSIS — Z8679 Personal history of other diseases of the circulatory system: Secondary | ICD-10-CM | POA: Diagnosis present

## 2023-07-18 DIAGNOSIS — Z8759 Personal history of other complications of pregnancy, childbirth and the puerperium: Secondary | ICD-10-CM | POA: Diagnosis present

## 2023-07-18 DIAGNOSIS — I428 Other cardiomyopathies: Secondary | ICD-10-CM | POA: Diagnosis not present

## 2023-07-18 DIAGNOSIS — O903 Peripartum cardiomyopathy: Secondary | ICD-10-CM

## 2023-07-18 DIAGNOSIS — R008 Other abnormalities of heart beat: Secondary | ICD-10-CM | POA: Diagnosis not present

## 2023-07-18 DIAGNOSIS — E876 Hypokalemia: Secondary | ICD-10-CM | POA: Diagnosis present

## 2023-07-18 LAB — RESP PANEL BY RT-PCR (RSV, FLU A&B, COVID)  RVPGX2
Influenza A by PCR: NEGATIVE
Influenza B by PCR: NEGATIVE
Resp Syncytial Virus by PCR: NEGATIVE
SARS Coronavirus 2 by RT PCR: NEGATIVE

## 2023-07-18 LAB — COMPREHENSIVE METABOLIC PANEL WITH GFR
ALT: 157 U/L — ABNORMAL HIGH (ref 0–44)
AST: 119 U/L — ABNORMAL HIGH (ref 15–41)
Albumin: 2.8 g/dL — ABNORMAL LOW (ref 3.5–5.0)
Alkaline Phosphatase: 77 U/L (ref 38–126)
Anion gap: 9 (ref 5–15)
BUN: 15 mg/dL (ref 6–20)
CO2: 18 mmol/L — ABNORMAL LOW (ref 22–32)
Calcium: 8.4 mg/dL — ABNORMAL LOW (ref 8.9–10.3)
Chloride: 111 mmol/L (ref 98–111)
Creatinine, Ser: 0.63 mg/dL (ref 0.44–1.00)
GFR, Estimated: >60 mL/min (ref >60)
Glucose, Bld: 80 mg/dL (ref 70–99)
Potassium: 4.2 mmol/L (ref 3.5–5.1)
Sodium: 138 mmol/L (ref 135–145)
Total Bilirubin: 1.1 mg/dL (ref 0.0–1.2)
Total Protein: 6 g/dL — ABNORMAL LOW (ref 6.5–8.1)

## 2023-07-18 LAB — CBC WITH DIFFERENTIAL/PLATELET
Abs Immature Granulocytes: 0.23 10*3/uL — ABNORMAL HIGH (ref 0.00–0.07)
Basophils Absolute: 0 10*3/uL (ref 0.0–0.1)
Basophils Relative: 1 %
Eosinophils Absolute: 0.3 10*3/uL (ref 0.0–0.5)
Eosinophils Relative: 3 %
HCT: 32.5 % — ABNORMAL LOW (ref 36.0–46.0)
Hemoglobin: 10.2 g/dL — ABNORMAL LOW (ref 12.0–15.0)
Immature Granulocytes: 3 %
Lymphocytes Relative: 23 %
Lymphs Abs: 1.8 10*3/uL (ref 0.7–4.0)
MCH: 26.6 pg (ref 26.0–34.0)
MCHC: 31.4 g/dL (ref 30.0–36.0)
MCV: 84.9 fL (ref 80.0–100.0)
Monocytes Absolute: 0.8 10*3/uL (ref 0.1–1.0)
Monocytes Relative: 10 %
Neutro Abs: 4.9 10*3/uL (ref 1.7–7.7)
Neutrophils Relative %: 60 %
Platelets: 240 10*3/uL (ref 150–400)
RBC: 3.83 MIL/uL — ABNORMAL LOW (ref 3.87–5.11)
RDW: 14.6 % (ref 11.5–15.5)
WBC: 8 10*3/uL (ref 4.0–10.5)
nRBC: 0.4 % — ABNORMAL HIGH (ref 0.0–0.2)

## 2023-07-18 LAB — TROPONIN I (HIGH SENSITIVITY): Troponin I (High Sensitivity): 9 ng/L (ref <18)

## 2023-07-18 LAB — URINALYSIS, ROUTINE W REFLEX MICROSCOPIC
Bacteria, UA: NONE SEEN
Bilirubin Urine: NEGATIVE
Glucose, UA: NEGATIVE mg/dL
Ketones, ur: NEGATIVE mg/dL
Nitrite: NEGATIVE
Protein, ur: 100 mg/dL — AB
Specific Gravity, Urine: 1.013 (ref 1.005–1.030)
pH: 5 (ref 5.0–8.0)

## 2023-07-18 LAB — I-STAT CG4 LACTIC ACID, ED: Lactic Acid, Venous: 1.2 mmol/L (ref 0.5–1.9)

## 2023-07-18 LAB — BRAIN NATRIURETIC PEPTIDE: B Natriuretic Peptide: 417.5 pg/mL — ABNORMAL HIGH (ref 0.0–100.0)

## 2023-07-18 MED ORDER — LEVETIRACETAM 500 MG PO TABS
500.0000 mg | ORAL_TABLET | Freq: Two times a day (BID) | ORAL | Status: DC
Start: 2023-07-19 — End: 2023-07-21
  Administered 2023-07-19 – 2023-07-21 (×4): 500 mg via ORAL
  Filled 2023-07-18 (×5): qty 1

## 2023-07-18 MED ORDER — LABETALOL HCL 5 MG/ML IV SOLN: 40.0000 mg | INTRAVENOUS | Status: DC | PRN

## 2023-07-18 MED ORDER — IBUPROFEN 600 MG PO TABS
600.0000 mg | ORAL_TABLET | Freq: Four times a day (QID) | ORAL | Status: DC | PRN
  Administered 2023-07-18 – 2023-07-19 (×2): 600 mg via ORAL
  Filled 2023-07-18 (×2): qty 1

## 2023-07-18 MED ORDER — METOCLOPRAMIDE HCL 5 MG/ML IJ SOLN: 10.0000 mg | Freq: Once | INTRAMUSCULAR | Status: DC

## 2023-07-18 MED ORDER — DIPHENHYDRAMINE HCL 50 MG/ML IJ SOLN: 12.5000 mg | Freq: Once | INTRAMUSCULAR | Status: DC

## 2023-07-18 MED ORDER — SODIUM CHLORIDE 0.9 % IV SOLN: 250.0000 mL | INTRAVENOUS | Status: AC | PRN

## 2023-07-18 MED ORDER — LISINOPRIL 10 MG PO TABS
10.0000 mg | ORAL_TABLET | Freq: Every day | ORAL | Status: DC
  Administered 2023-07-18: 10 mg via ORAL
  Filled 2023-07-18: qty 1

## 2023-07-18 MED ORDER — HYDRALAZINE HCL 20 MG/ML IJ SOLN
10.0000 mg | INTRAMUSCULAR | Status: DC | PRN
  Filled 2023-07-18: qty 1

## 2023-07-18 MED ORDER — FUROSEMIDE 20 MG PO TABS
20.0000 mg | ORAL_TABLET | Freq: Two times a day (BID) | ORAL | Status: DC
  Administered 2023-07-18: 20 mg via ORAL
  Filled 2023-07-18: qty 1

## 2023-07-18 MED ORDER — LABETALOL HCL 5 MG/ML IV SOLN: 80.0000 mg | INTRAVENOUS | Status: DC | PRN

## 2023-07-18 MED ORDER — SODIUM CHLORIDE 0.9% FLUSH: 3.0000 mL | INTRAVENOUS | Status: DC | PRN

## 2023-07-18 MED ORDER — POTASSIUM CHLORIDE 20 MEQ PO PACK
20.0000 meq | PACK | Freq: Two times a day (BID) | ORAL | Status: DC
Start: 2023-07-18 — End: 2023-07-19
  Administered 2023-07-18 – 2023-07-19 (×2): 20 meq via ORAL
  Filled 2023-07-18 (×2): qty 1

## 2023-07-18 MED ORDER — MAGNESIUM SULFATE 40 GM/1000ML IV SOLN
1.0000 g/h | INTRAVENOUS | Status: DC
  Filled 2023-07-18: qty 1000

## 2023-07-18 MED ORDER — FUROSEMIDE 10 MG/ML IJ SOLN
40.0000 mg | Freq: Once | INTRAMUSCULAR | Status: AC
  Administered 2023-07-18: 40 mg via INTRAVENOUS
  Filled 2023-07-18: qty 4

## 2023-07-18 MED ORDER — ENOXAPARIN SODIUM 40 MG/0.4ML IJ SOSY
40.0000 mg | PREFILLED_SYRINGE | INTRAMUSCULAR | Status: DC
  Administered 2023-07-18 – 2023-07-20 (×3): 40 mg via SUBCUTANEOUS
  Filled 2023-07-18 (×3): qty 0.4

## 2023-07-18 MED ORDER — LABETALOL HCL 5 MG/ML IV SOLN: 20.0000 mg | INTRAVENOUS | Status: DC | PRN

## 2023-07-18 MED ORDER — MAGNESIUM SULFATE BOLUS VIA INFUSION
4.0000 g | Freq: Once | INTRAVENOUS | Status: DC
  Filled 2023-07-18: qty 1000

## 2023-07-18 MED ORDER — HYDRALAZINE HCL 20 MG/ML IJ SOLN: 10.0000 mg | INTRAMUSCULAR | Status: DC | PRN

## 2023-07-18 MED ORDER — LEVETIRACETAM (KEPPRA) 500 MG/5 ML ADULT IV PUSH
1000.0000 mg | Freq: Two times a day (BID) | INTRAVENOUS | Status: AC
  Administered 2023-07-18 – 2023-07-19 (×2): 1000 mg via INTRAVENOUS
  Filled 2023-07-18 (×2): qty 10

## 2023-07-18 MED ORDER — LABETALOL HCL 5 MG/ML IV SOLN
80.0000 mg | INTRAVENOUS | Status: DC | PRN
  Filled 2023-07-18: qty 16

## 2023-07-18 MED ORDER — LABETALOL HCL 5 MG/ML IV SOLN
20.0000 mg | INTRAVENOUS | Status: DC | PRN
  Administered 2023-07-18: 20 mg via INTRAVENOUS

## 2023-07-18 MED ORDER — LABETALOL HCL 5 MG/ML IV SOLN
20.0000 mg | INTRAVENOUS | Status: DC | PRN
  Filled 2023-07-18: qty 4

## 2023-07-18 MED ORDER — IOHEXOL 350 MG/ML SOLN
75.0000 mL | Freq: Once | INTRAVENOUS | Status: AC | PRN
  Administered 2023-07-18: 75 mL via INTRAVENOUS

## 2023-07-18 MED ORDER — METOPROLOL TARTRATE 25 MG/10 ML ORAL SUSPENSION
25.0000 mg | Freq: Every day | ORAL | Status: DC
  Filled 2023-07-18: qty 10

## 2023-07-18 MED ORDER — SODIUM CHLORIDE 0.9% FLUSH
3.0000 mL | Freq: Two times a day (BID) | INTRAVENOUS | Status: DC
  Administered 2023-07-18 – 2023-07-21 (×5): 3 mL via INTRAVENOUS

## 2023-07-18 NOTE — Progress Notes (Signed)
 BLE venous duplex has been completed.  Preliminary results given to Dr. Adrain Alar.   Results can be found under chart review under CV PROC. 07/18/2023 5:49 PM Adella Manolis RVT, RDMS

## 2023-07-18 NOTE — ED Triage Notes (Signed)
 Pt came in d/t CP, SOB, cough, Lt foot swelling & during triage she reports she just gave birth 2 wks ago & she is hypertensive. A/Ox4, rates her pain 9/10 in her chest.

## 2023-07-18 NOTE — Consult Note (Signed)
 Cardiology Consultation:   Patient ID: April Boone MRN: [id number]; DOB: 1985/05/10  Admit date: 07/18/2023 Date of Consult: 07/18/2023  Primary Care Provider: Pcp, No [facility] Cardiologist: None  [facility] Electrophysiologist:  None    Patient Profile:   April Boone is a 38 y.o. 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  History of Present Illness:   April Boone reports having a frequent cough, with associated pain in her chest.  She has been having intermittent headaches which are worse with coughing. She has had worsening LE edema (left > right) over the past 3 days; she did not have any leg swelling with prior pregnancies. Last night she was not able to sleep flat, as she has pain in her chest when lying flat and that she "can't catch my breath" when lying flat. She doesn't know if she feels short of breath as she hasn't really walked much outside of her room. She is breast feeding, bottle feeding, and pumping. She feels like her breast milk is coming in.   She is unsure what her BPs were during her pregnancy, but she received normal prenatal care, and reports Bps were always told to her to be "good". She reports having no past medical history, and no hx of heart problems with her prior pregnancies.  She was born in [place].  Past Medical History:  Diagnosis Date   Gestational diabetes    Obese     Past Surgical History:  Procedure Laterality Date   NO PAST SURGERIES       Home Medications:  Prior to Admission medications   Medication Sig Start Date End Date Taking? Authorizing Provider  acetaminophen  (TYLENOL ) 500 MG tablet Take 2 tablets (1,000 mg total) by mouth every 6 (six) hours as needed for headache. 07/11/23   Darrow End, MD  benzocaine -Menthol  (DERMOPLAST) 20-0.5 % AERO Apply 1 Application topically as needed for irritation (perineal  discomfort). 07/11/23   Leveque, Alyssa, MD  dibucaine (NUPERCAINAL) 1 % OINT Place 1 Application rectally as needed for hemorrhoids. 07/11/23   Leveque, Alyssa, MD  ibuprofen  (ADVIL ) 600 MG tablet Take 1 tablet (600 mg total) by mouth every 6 (six) hours. 07/11/23   Leveque, Alyssa, MD  Prenatal Vit-Fe Fumarate-FA (PRENATAL VITAMINS) 28-0.8 MG TABS Take 1 tablet by mouth daily. 02/10/23   Constant, Peggy, MD  Vitamin D , Cholecalciferol , 25 MCG (1000 UT) CAPS Take 1 tablet by mouth daily. 06/07/23   Davis, Devon E, PA-C  witch hazel-glycerin  (TUCKS) pad Apply 1 Application topically as needed for hemorrhoids. 07/11/23   Leveque, Alyssa, MD    Inpatient Medications: Scheduled Meds:  diphenhydrAMINE   12.5 mg Intravenous Once   metoCLOPramide (REGLAN) injection  10 mg Intravenous Once   Continuous Infusions:  PRN Meds: labetalol **AND** labetalol **AND** labetalol **AND** hydrALAZINE **AND** Measure blood pressure  Allergies:   No Known Allergies  Social History:   Social History   Socioeconomic History   Marital status: Single    Spouse name: Not on file   Number of children: Not on file   Years of education: Not on file   Highest education level: Not on file  Occupational History   Not on file  Tobacco Use   Smoking status: Never   Smokeless tobacco: Never  Vaping Use   Vaping status: Never Used  Substance and Sexual Activity   Alcohol use: Not Currently   Drug  use: Not Currently   Sexual activity: Not Currently    Birth control/protection: None  Other Topics Concern   Not on file  Social History Narrative   Not on file   Social Drivers of Health   Financial Resource Strain: Not on file  Food Insecurity: Food Insecurity Present (07/09/2023)   Hunger Vital Sign    Worried About Running Out of Food in the Last Year: Often true    Ran Out of Food in the Last Year: Sometimes true  Transportation Needs: No Transportation Needs (07/09/2023)   PRAPARE - Scientist, research (physical sciences) (Medical): No    Lack of Transportation (Non-Medical): No  Physical Activity: Not on file  Stress: Not on file  Social Connections: Not on file  Intimate Partner Violence: Not At Risk (07/09/2023)   Humiliation, Afraid, Rape, and Kick questionnaire    Fear of Current or Ex-Partner: No    Emotionally Abused: No    Physically Abused: No    Sexually Abused: No    Family History:    Family History  Problem Relation Age of Onset   Healthy Mother    Healthy Father      ROS:  Review of Systems: [y] = yes, [ ]  = no      General: Weight gain [ ] ; Weight loss [ ] ; Anorexia [ ] ; Fatigue [ ] ; Fever [ ] ; Chills [ ] ; Weakness [ ]    Cardiac: Chest pain/pressure [ y]; Resting SOB [ y]; Exertional SOB [ ] ; Orthopnea Davis.Dad ]; Pedal Edema [ y]; Palpitations [ ] ; Syncope [ ] ; Presyncope [ ] ; Paroxysmal nocturnal dyspnea [ ]    Pulmonary: Cough [ ] ; Wheezing [ ] ; Hemoptysis [ ] ; Sputum [ ] ; Snoring [ ]    GI: Vomiting [ ] ; Dysphagia [ ] ; Melena [ ] ; Hematochezia [ ] ; Heartburn [ ] ; Abdominal pain [ ] ; Constipation [ ] ; Diarrhea [ ] ; BRBPR [ ]    GU: Hematuria [ ] ; Dysuria [ ] ; Nocturia [ ]  Vascular: Pain in legs with walking [ ] ; Pain in feet with lying flat [ ] ; Non-healing sores [ ] ; Stroke [ ] ; TIA [ ] ; Slurred speech [ ] ;   Neuro: Headaches [ ] ; Vertigo [ ] ; Seizures [ ] ; Paresthesias [ ] ;Blurred vision [ ] ; Diplopia [ ] ; Vision changes [ ]    Ortho/Skin: Arthritis [ ] ; Joint pain [ ] ; Muscle pain [ ] ; Joint swelling [ ] ; Back Pain [ ] ; Rash [ ]    Psych: Depression [ ] ; Anxiety [ ]    Heme: Bleeding problems [ ] ; Clotting disorders [ ] ; Anemia [ ]    Endocrine: Diabetes [ ] ; Thyroid dysfunction [ ]    Physical Exam/Data:   Vitals:   07/18/23 1826 07/18/23 1830 07/18/23 1845 07/18/23 1900  BP: (!) 161/79 (!) 152/82 (!) 156/95 (!) 155/120  Pulse: 65 64 65 (!) 55  Resp: 18     Temp:      SpO2: 100% 99% 100% 100%   No intake or output data in the 24 hours ending 07/18/23 1942    07/09/2023     8:09 AM 07/06/2023   11:30 AM 06/29/2023    1:46 PM  Last 3 Weights  Weight (lbs) 187 lb 12.8 oz 183 lb 14.4 oz 188 lb 12.8 oz  Weight (kg) 85.186 kg 83.416 kg 85.639 kg     There is no height or weight on file to calculate BMI.  General:  Well nourished, well developed, in no acute distress, coughing constantly  HEENT: normal Lymph: no adenopathy Neck: no JVD Endocrine:  No thryomegaly Vascular: No carotid bruits; FA pulses 2+ bilaterally without bruits  Cardiac:  normal S1, S2; RRR; no murmur Lungs:  clear to auscultation bilaterally, no wheezing, rhonchi or rales  Abd: soft, nontender, no hepatomegaly  Ext: L>R lower extremity edema 1+ Musculoskeletal:  No deformities, BUE and BLE strength normal and equal Skin: warm and dry  Neuro:  grossly normal Psych:  Normal affect   EKG:  The EKG was personally reviewed and demonstrates:  NSR, nonspecific ST elevation Telemetry:  Telemetry was personally reviewed and demonstrates:  not on tele  Relevant CV Studies: No prior  Laboratory Data:  High Sensitivity Troponin:   Recent Labs  Lab 07/18/23 1715  TROPONINIHS 9     Chemistry Recent Labs  Lab 07/18/23 1715  NA 138  K 4.2  CL 111  CO2 18*  GLUCOSE 80  BUN 15  CREATININE 0.63  CALCIUM  8.4*  GFRNONAA >60  ANIONGAP 9    Recent Labs  Lab 07/18/23 1715  PROT 6.0*  ALBUMIN 2.8*  AST 119*  ALT 157*  ALKPHOS 77  BILITOT 1.1   Hematology Recent Labs  Lab 07/18/23 1715  WBC 8.0  RBC 3.83*  HGB 10.2*  HCT 32.5*  MCV 84.9  MCH 26.6  MCHC 31.4  RDW 14.6  PLT 240   BNP Recent Labs  Lab 07/18/23 1715  BNP 417.5*    DDimer No results for input(s): "DDIMER" in the last 168 hours.  Radiology/Studies:  CT Angio Chest PE W and/or Wo Contrast Result Date: 07/18/2023 CLINICAL DATA:  Pulmonary embolism (PE) suspected, high prob. Chest pain, cough EXAM: CT ANGIOGRAPHY CHEST WITH CONTRAST TECHNIQUE: Multidetector CT imaging of the chest was performed using the  standard protocol during bolus administration of intravenous contrast. Multiplanar CT image reconstructions and MIPs were obtained to evaluate the vascular anatomy. RADIATION DOSE REDUCTION: This exam was performed according to the departmental dose-optimization program which includes automated exposure control, adjustment of the mA and/or kV according to patient size and/or use of iterative reconstruction technique. CONTRAST:  75mL OMNIPAQUE  IOHEXOL  350 MG/ML SOLN COMPARISON:  None Available. FINDINGS: Cardiovascular: Cardiomegaly. Aorta normal caliber. No filling defects in the pulmonary arteries to suggest pulmonary emboli. Mediastinum/Nodes: No mediastinal, hilar, or axillary adenopathy. Trachea and esophagus are unremarkable. Thyroid unremarkable. Lungs/Pleura: Bilateral airspace disease. Moderate right pleural effusion and small left pleural effusion. Upper Abdomen: No acute findings Musculoskeletal: Chest wall soft tissues are unremarkable. No acute bony abnormality. Review of the MIP images confirms the above findings. IMPRESSION: No evidence of pulmonary embolus. Mild cardiomegaly. Bilateral airspace disease and effusions. This could reflect edema or infection. Electronically Signed   By: Janeece Mechanic M.D.   On: 07/18/2023 19:26   VAS US  LOWER EXTREMITY VENOUS (DVT) (ONLY [facility] & [facility]) Result Date: 07/18/2023  Lower Venous DVT Study Patient Name:  April Boone  Date of Exam:   07/18/2023 Medical Rec #: [id number]           Accession #:    [id number] Date of Birth: [birth year]            Patient Gender: F Patient Age:   63 years Exam Location:  [facility] [place] Procedure:      VAS US  LOWER EXTREMITY VENOUS (DVT) Referring Phys: Rosealee Concha --------------------------------------------------------------------------------  Indications: Edema.  Risk Factors: Recently pregnant (delivered 07/10/2023). Limitations: Patient continuously coughing. Comparison Study: No previous exams Performing Technologist: Jody  Hill RVT, RDMS  Examination Guidelines: A complete evaluation includes B-mode imaging, spectral Doppler, color Doppler, and power Doppler as needed of all accessible portions of each vessel. Bilateral testing is considered an integral part of a complete examination. Limited examinations for reoccurring indications may be performed as noted. The reflux portion of the exam is performed with the patient in reverse Trendelenburg.  +---------+---------------+---------+-----------+----------+--------------+ RIGHT    CompressibilityPhasicitySpontaneityPropertiesThrombus Aging +---------+---------------+---------+-----------+----------+--------------+ CFV      Full           No       Yes                                 +---------+---------------+---------+-----------+----------+--------------+ SFJ      Full                                                        +---------+---------------+---------+-----------+----------+--------------+ FV Prox  Full           No       Yes                                 +---------+---------------+---------+-----------+----------+--------------+ FV Mid   Full           No       Yes                                 +---------+---------------+---------+-----------+----------+--------------+ FV DistalFull           No       Yes                                 +---------+---------------+---------+-----------+----------+--------------+ PFV      Full                                                        +---------+---------------+---------+-----------+----------+--------------+ POP      Full           No       Yes                                 +---------+---------------+---------+-----------+----------+--------------+ PTV      Full                                                        +---------+---------------+---------+-----------+----------+--------------+ PERO     Full                                                         +---------+---------------+---------+-----------+----------+--------------+   +---------+---------------+---------+-----------+----------+--------------+  LEFT     CompressibilityPhasicitySpontaneityPropertiesThrombus Aging +---------+---------------+---------+-----------+----------+--------------+ CFV      Full           No       Yes                                 +---------+---------------+---------+-----------+----------+--------------+ SFJ      Full                                                        +---------+---------------+---------+-----------+----------+--------------+ FV Prox  Full           No       Yes                                 +---------+---------------+---------+-----------+----------+--------------+ FV Mid   Full           No       Yes                                 +---------+---------------+---------+-----------+----------+--------------+ FV DistalFull           No       Yes                                 +---------+---------------+---------+-----------+----------+--------------+ PFV      Full                                                        +---------+---------------+---------+-----------+----------+--------------+ POP      Full           No       Yes                                 +---------+---------------+---------+-----------+----------+--------------+ PTV      Full                                                        +---------+---------------+---------+-----------+----------+--------------+ PERO     Full                                                        +---------+---------------+---------+-----------+----------+--------------+    Summary: BILATERAL: - No evidence of deep vein thrombosis seen in the lower extremities, bilaterally. -No evidence of popliteal cyst, bilaterally. -Pulsatile doppler waveforms, bilaterally.  *See table(s) above for measurements and observations.    Preliminary          Assessment and Plan:  C/f Peripartum Cardiomyopathy Orthopnea, LE Edema, Elevated BNP  She is 8 days postpartum, with recent delivery on 07/11/2023.  She was induced at 37 weeks and 3 days for poorly controlled gestational diabetes mellitus.  Her labor course was complicated by prolonged fetal decels, requiring vacuum-assisted vaginal delivery.  The rest of her hospitalization was unremarkable. For the past 4 days, she has had new onset orthopnea, LE edema, cough, and shortness of breath.  Last night she was barely able to sleep due to dyspnea, and thus she presented to the emergency department.  CT PE negative for PE, but showing pulmonary edema and cardiomegaly. BNP elevated to 417. Ddx includes peripartum cardiomyopathy (most likely), includes pre-existing cardiomyopathy (no prior s/syx), hypertension related diastolic heart failure. Will need echo tomorrow to help with diagnosis. If echo confirms diagnosis of peripartum cardiomyoptahy with EF < 50%, she will need to be started on GDMT. She would be high risk for recurrence of peripartum cardiomyopathy in future pregnancies, and thus should avoid future pregnancy due to risk of HF progression and death. Reliable contraception (including permanent) should be strongly advised. -TTE in am -Overnight diuresis with furosemide 40mg , redose furosemide 40mg  IV in am once confirmed electrolytes repleted -BMP and magnesium q12h while diuresing -Start lisinopril 10mg  this evening -Tomorrow, start metoprolol succinate 25mg  daily -Further GDMT (mineralocorticoid antagonist, SGLT2i, +/- hydralazine/isosorbide dinitrate [ethnicity]) pending further diagnosis and echo -prn BP control to SBP < 160, ideally less that 140. Okay for hydralazine or labetalol -iron panel with am labs -telemetry -strict I/Os, daily weights -2L fluid restriction -Heart failure team to see in am      For questions or updates, please contact [facility] Please consult  [email or web address] for contact info under     Signed, Chauncey Cora, MD  07/18/2023 7:42 PM

## 2023-07-18 NOTE — H&P (Addendum)
 [facility] Attending History and Physical  April Boone is a 38 y.o. [id number] who presented to [facility] today for evaluation of SOB, cough, CP and LE left foot swelling. She gave birth on 5/10 via VAVD at [redacted]w[redacted]d that was otherwise uncomplicated. She has no history of preeclampsia in the past. Her pregnancy was complicated by maternal obesity, GDM, and GBS bacteriuria.   She reports onset of cough and shortness of breath yesterday. Associated chest pain and headache as well. States she has to sit straight up to breath properly.  She denies any personal or family history of cardiac disease.  Past Medical History:  Diagnosis Date   Gestational diabetes    Obese    Past Surgical History:  Procedure Laterality Date   NO PAST SURGERIES     OB History  Gravida Para Term Preterm AB Living  5 4 4  1 5   SAB IAB Ectopic Multiple Live Births  1   1 5     # Outcome Date GA Lbr Len/2nd Weight Sex Type Anes PTL Lv  5 Term 07/10/23 [redacted]w[redacted]d / 00:29 3110 g F Vag-Vacuum EPI  LIV  4 SAB 06/2020          3 Term 04/2012     Vag-Spont   LIV  2 Term 03/02/09 [redacted]w[redacted]d   M Vag-Spont None  LIV  1A Term 02/14/07     Vag-Spont   LIV  1B Term 02/14/07     Vag-Spont  N LIV  Patient denies any other pertinent gynecologic issues.  No current facility-administered medications on file prior to encounter.   Current Outpatient Medications on File Prior to Encounter  Medication Sig Dispense Refill   acetaminophen  (TYLENOL ) 500 MG tablet Take 2 tablets (1,000 mg total) by mouth every 6 (six) hours as needed for headache. 30 tablet 0   benzocaine -Menthol  (DERMOPLAST) 20-0.5 % AERO Apply 1 Application topically as needed for irritation (perineal discomfort).     dibucaine (NUPERCAINAL) 1 % OINT Place 1 Application rectally as needed for hemorrhoids.     ibuprofen  (ADVIL ) 600 MG tablet Take 1 tablet (600 mg total) by mouth every 6 (six) hours. 30 tablet 0   Prenatal Vit-Fe Fumarate-FA  (PRENATAL VITAMINS) 28-0.8 MG TABS Take 1 tablet by mouth daily. 30 tablet 12   Vitamin D , Cholecalciferol , 25 MCG (1000 UT) CAPS Take 1 tablet by mouth daily. 60 capsule 3   witch hazel-glycerin  (TUCKS) pad Apply 1 Application topically as needed for hemorrhoids.     No Known Allergies  Social History:   reports that she has never smoked. She has never used smokeless tobacco. She reports that she does not currently use alcohol. She reports that she does not currently use drugs. Family History  Problem Relation Age of Onset   Healthy Mother    Healthy Father     Review of Systems: Pertinent items noted in HPI and remainder of comprehensive ROS otherwise negative.  PHYSICAL EXAM: Blood pressure (!) 175/97, pulse 79, temperature 98.4 F (36.9 C), resp. rate 18, last menstrual period 10/11/2022, SpO2 100%, unknown if currently breastfeeding. CONSTITUTIONAL: Well-developed, well-nourished female in no acute distress. Coughing but able to talk in complete sentences without signs of respiratory distress HENT:  Normocephalic, atraumatic, External right and left ear normal. Oropharynx is clear and moist EYES: Conjunctivae and EOM are normal. Pupils are equal, round, and reactive to light. No scleral icterus.  NECK: Normal range of motion, supple, no masses SKIN: Skin is warm and  dry. No rash noted. Not diaphoretic. No erythema. No pallor. NEUROLOGIC: Alert and oriented to person, place, and time. Normal reflexes, muscle tone coordination. No cranial nerve deficit noted. PSYCHIATRIC: Normal mood and affect. Normal behavior. Normal judgment and thought content. CARDIOVASCULAR: Normal heart rate noted, regular rhythm RESPIRATORY: Effort normal, +crackles bilaterally, coughing during exam ABDOMEN: Soft, nontender, nondistended. PELVIC: Not examined MUSCULOSKELETAL: Normal range of motion. No tenderness.  No cyanosis, clubbing, or edema.    Labs: Results for orders placed or performed during the  hospital encounter of 07/18/23 (from the past 2 weeks)  Resp panel by RT-PCR (RSV, Flu A&B, Covid) Anterior Nasal Swab   Collection Time: 07/18/23  4:39 PM   Specimen: Anterior Nasal Swab  Result Value Ref Range   SARS Coronavirus 2 by RT PCR NEGATIVE NEGATIVE   Influenza A by PCR NEGATIVE NEGATIVE   Influenza B by PCR NEGATIVE NEGATIVE   Resp Syncytial Virus by PCR NEGATIVE NEGATIVE  CBC with Differential   Collection Time: 07/18/23  5:15 PM  Result Value Ref Range   WBC 8.0 4.0 - 10.5 K/uL   RBC 3.83 (L) 3.87 - 5.11 MIL/uL   Hemoglobin 10.2 (L) 12.0 - 15.0 g/dL   HCT 16.1 (L) 09.6 - 04.5 %   MCV 84.9 80.0 - 100.0 fL   MCH 26.6 26.0 - 34.0 pg   MCHC 31.4 30.0 - 36.0 g/dL   RDW 40.9 81.1 - 91.4 %   Platelets 240 150 - 400 K/uL   nRBC 0.4 (H) 0.0 - 0.2 %   Neutrophils Relative % 60 %   Neutro Abs 4.9 1.7 - 7.7 K/uL   Lymphocytes Relative 23 %   Lymphs Abs 1.8 0.7 - 4.0 K/uL   Monocytes Relative 10 %   Monocytes Absolute 0.8 0.1 - 1.0 K/uL   Eosinophils Relative 3 %   Eosinophils Absolute 0.3 0.0 - 0.5 K/uL   Basophils Relative 1 %   Basophils Absolute 0.0 0.0 - 0.1 K/uL   Immature Granulocytes 3 %   Abs Immature Granulocytes 0.23 (H) 0.00 - 0.07 K/uL  Comprehensive metabolic panel   Collection Time: 07/18/23  5:15 PM  Result Value Ref Range   Sodium 138 135 - 145 mmol/L   Potassium 4.2 3.5 - 5.1 mmol/L   Chloride 111 98 - 111 mmol/L   CO2 18 (L) 22 - 32 mmol/L   Glucose, Bld 80 70 - 99 mg/dL   BUN 15 6 - 20 mg/dL   Creatinine, Ser 7.82 0.44 - 1.00 mg/dL   Calcium  8.4 (L) 8.9 - 10.3 mg/dL   Total Protein 6.0 (L) 6.5 - 8.1 g/dL   Albumin 2.8 (L) 3.5 - 5.0 g/dL   AST 956 (H) 15 - 41 U/L   ALT 157 (H) 0 - 44 U/L   Alkaline Phosphatase 77 38 - 126 U/L   Total Bilirubin 1.1 0.0 - 1.2 mg/dL   GFR, Estimated >21 >30 mL/min   Anion gap 9 5 - 15  Brain natriuretic peptide   Collection Time: 07/18/23  5:15 PM  Result Value Ref Range   B Natriuretic Peptide 417.5 (H) 0.0 -  100.0 pg/mL  Troponin I (High Sensitivity)   Collection Time: 07/18/23  5:15 PM  Result Value Ref Range   Troponin I (High Sensitivity) 9 <18 ng/L  Urinalysis, Routine w reflex microscopic -Urine, Clean Catch   Collection Time: 07/18/23  6:59 PM  Result Value Ref Range   Color, Urine YELLOW YELLOW   APPearance  CLEAR CLEAR   Specific Gravity, Urine 1.013 1.005 - 1.030   pH 5.0 5.0 - 8.0   Glucose, UA NEGATIVE NEGATIVE mg/dL   Hgb urine dipstick MODERATE (A) NEGATIVE   Bilirubin Urine NEGATIVE NEGATIVE   Ketones, ur NEGATIVE NEGATIVE mg/dL   Protein, ur 409 (A) NEGATIVE mg/dL   Nitrite NEGATIVE NEGATIVE   Leukocytes,Ua SMALL (A) NEGATIVE   RBC / HPF 11-20 0 - 5 RBC/hpf   WBC, UA 11-20 0 - 5 WBC/hpf   Bacteria, UA NONE SEEN NONE SEEN   Squamous Epithelial / HPF 0-5 0 - 5 /HPF   Mucus PRESENT   Results for orders placed or performed during the hospital encounter of 07/09/23 (from the past 2 weeks)  Glucose, capillary   Collection Time: 07/09/23  7:50 AM  Result Value Ref Range   Glucose-Capillary 99 70 - 99 mg/dL  CBC   Collection Time: 07/09/23  7:59 AM  Result Value Ref Range   WBC 7.9 4.0 - 10.5 K/uL   RBC 3.75 (L) 3.87 - 5.11 MIL/uL   Hemoglobin 9.9 (L) 12.0 - 15.0 g/dL   HCT 81.1 (L) 91.4 - 78.2 %   MCV 81.1 80.0 - 100.0 fL   MCH 26.4 26.0 - 34.0 pg   MCHC 32.6 30.0 - 36.0 g/dL   RDW 95.6 21.3 - 08.6 %   Platelets 214 150 - 400 K/uL   nRBC 0.0 0.0 - 0.2 %  RPR   Collection Time: 07/09/23  7:59 AM  Result Value Ref Range   RPR Ser Ql NON REACTIVE NON REACTIVE  Type and screen   Collection Time: 07/09/23  7:59 AM  Result Value Ref Range   ABO/RH(D) B POS    Antibody Screen NEG    Sample Expiration      07/12/2023,2359 Performed at [facility], [address] [place], [place] [address]   Glucose, capillary   Collection Time: 07/09/23 11:53 AM  Result Value Ref Range   Glucose-Capillary 77 70 - 99 mg/dL  Glucose, capillary   Collection Time: 07/09/23   5:15 PM  Result Value Ref Range   Glucose-Capillary 82 70 - 99 mg/dL  Glucose, capillary   Collection Time: 07/09/23  9:25 PM  Result Value Ref Range   Glucose-Capillary 80 70 - 99 mg/dL  Glucose, capillary   Collection Time: 07/10/23  1:14 AM  Result Value Ref Range   Glucose-Capillary 121 (H) 70 - 99 mg/dL  Glucose, capillary   Collection Time: 07/10/23  5:29 AM  Result Value Ref Range   Glucose-Capillary 106 (H) 70 - 99 mg/dL  Glucose, capillary   Collection Time: 07/10/23  7:22 AM  Result Value Ref Range   Glucose-Capillary 107 (H) 70 - 99 mg/dL  CBC   Collection Time: 07/11/23  4:36 AM  Result Value Ref Range   WBC 14.4 (H) 4.0 - 10.5 K/uL   RBC 3.25 (L) 3.87 - 5.11 MIL/uL   Hemoglobin 8.7 (L) 12.0 - 15.0 g/dL   HCT 96.2 (L) 95.2 - 84.1 %   MCV 82.2 80.0 - 100.0 fL   MCH 26.8 26.0 - 34.0 pg   MCHC 32.6 30.0 - 36.0 g/dL   RDW 32.4 40.1 - 02.7 %   Platelets 159 150 - 400 K/uL   nRBC 0.0 0.0 - 0.2 %  Glucose, capillary   Collection Time: 07/11/23  5:16 AM  Result Value Ref Range   Glucose-Capillary 92 70 - 99 mg/dL    Imaging Studies:  CT Angio Chest PE W and/or Wo Contrast Result Date: 07/18/2023 CLINICAL DATA:  Pulmonary embolism (PE) suspected, high prob. Chest pain, cough EXAM: CT ANGIOGRAPHY CHEST WITH CONTRAST TECHNIQUE: Multidetector CT imaging of the chest was performed using the standard protocol during bolus administration of intravenous contrast. Multiplanar CT image reconstructions and MIPs were obtained to evaluate the vascular anatomy. RADIATION DOSE REDUCTION: This exam was performed according to the departmental dose-optimization program which includes automated exposure control, adjustment of the mA and/or kV according to patient size and/or use of iterative reconstruction technique. CONTRAST:  75mL OMNIPAQUE  IOHEXOL  350 MG/ML SOLN COMPARISON:  None Available. FINDINGS: Cardiovascular: Cardiomegaly. Aorta normal caliber. No filling defects in the pulmonary  arteries to suggest pulmonary emboli. Mediastinum/Nodes: No mediastinal, hilar, or axillary adenopathy. Trachea and esophagus are unremarkable. Thyroid unremarkable. Lungs/Pleura: Bilateral airspace disease. Moderate right pleural effusion and small left pleural effusion. Upper Abdomen: No acute findings Musculoskeletal: Chest wall soft tissues are unremarkable. No acute bony abnormality. Review of the MIP images confirms the above findings. IMPRESSION: No evidence of pulmonary embolus. Mild cardiomegaly. Bilateral airspace disease and effusions. This could reflect edema or infection. Electronically Signed   By: Janeece Mechanic M.D.   On: 07/18/2023 19:26   VAS US  LOWER EXTREMITY VENOUS (DVT) (ONLY [facility] & [facility]) Result Date: 07/18/2023  Lower Venous DVT Study Patient Name:  CHARVI GAMMAGE  Date of Exam:   07/18/2023 Medical Rec #: [id number]           Accession #:    [id number] Date of Birth: 25-Oct-1985            Patient Gender: F Patient Age:   28 years Exam Location:  [facility] Procedure:      VAS US  LOWER EXTREMITY VENOUS (DVT) Referring Phys: Rosealee Concha --------------------------------------------------------------------------------  Indications: Edema.  Risk Factors: Recently pregnant (delivered 07/10/2023). Limitations: Patient continuously coughing. Comparison Study: No previous exams Performing Technologist: Jody Hill RVT, RDMS  Examination Guidelines: A complete evaluation includes B-mode imaging, spectral Doppler, color Doppler, and power Doppler as needed of all accessible portions of each vessel. Bilateral testing is considered an integral part of a complete examination. Limited examinations for reoccurring indications may be performed as noted. The reflux portion of the exam is performed with the patient in reverse Trendelenburg.  +---------+---------------+---------+-----------+----------+--------------+ RIGHT    CompressibilityPhasicitySpontaneityPropertiesThrombus Aging  +---------+---------------+---------+-----------+----------+--------------+ CFV      Full           No       Yes                                 +---------+---------------+---------+-----------+----------+--------------+ SFJ      Full                                                        +---------+---------------+---------+-----------+----------+--------------+ FV Prox  Full           No       Yes                                 +---------+---------------+---------+-----------+----------+--------------+ FV Mid   Full           No  Yes                                 +---------+---------------+---------+-----------+----------+--------------+ FV DistalFull           No       Yes                                 +---------+---------------+---------+-----------+----------+--------------+ PFV      Full                                                        +---------+---------------+---------+-----------+----------+--------------+ POP      Full           No       Yes                                 +---------+---------------+---------+-----------+----------+--------------+ PTV      Full                                                        +---------+---------------+---------+-----------+----------+--------------+ PERO     Full                                                        +---------+---------------+---------+-----------+----------+--------------+   +---------+---------------+---------+-----------+----------+--------------+ LEFT     CompressibilityPhasicitySpontaneityPropertiesThrombus Aging +---------+---------------+---------+-----------+----------+--------------+ CFV      Full           No       Yes                                 +---------+---------------+---------+-----------+----------+--------------+ SFJ      Full                                                         +---------+---------------+---------+-----------+----------+--------------+ FV Prox  Full           No       Yes                                 +---------+---------------+---------+-----------+----------+--------------+ FV Mid   Full           No       Yes                                 +---------+---------------+---------+-----------+----------+--------------+ FV DistalFull           No       Yes                                 +---------+---------------+---------+-----------+----------+--------------+  PFV      Full                                                        +---------+---------------+---------+-----------+----------+--------------+ POP      Full           No       Yes                                 +---------+---------------+---------+-----------+----------+--------------+ PTV      Full                                                        +---------+---------------+---------+-----------+----------+--------------+ PERO     Full                                                        +---------+---------------+---------+-----------+----------+--------------+    Summary: BILATERAL: - No evidence of deep vein thrombosis seen in the lower extremities, bilaterally. -No evidence of popliteal cyst, bilaterally. -Pulsatile doppler waveforms, bilaterally.  *See table(s) above for measurements and observations.    Preliminary    US  MFM OB FOLLOW UP Result Date: 06/29/2023 ----------------------------------------------------------------------  OBSTETRICS REPORT                       (Signed Final 06/29/2023 01:39 pm) ---------------------------------------------------------------------- Patient Info  ID #:       [id number]                          D.O.B.:  [birth year] (37 yrs)(F)  Name:       April Boone              Visit Date: 06/29/2023 09:13 am ---------------------------------------------------------------------- Performed By  Attending:        Sal Crass MD         Ref. Address:     [address]                                                             [place], [place]                                                             [address]  Performed By:     Elsie Halo BS      Location[facility]                                 [facility]  Referred By:      [facility] ---------------------------------------------------------------------- Orders  #  Description                           Code        Ordered By  1  US  MFM OB FOLLOW UP                   [id number]    Jodeen Munch  2  US  MFM FETAL BPP WO NON               76819.01    [facility] FORSYTH     STRESS ----------------------------------------------------------------------  #  Order #                     Accession #                Episode #  1  [id number]                   [id number]                 [id number]  2  [id number]                   [id number]                 [id number] ---------------------------------------------------------------------- Indications  Gestational diabetes in pregnancy, insulin      O24.414  controlled  Advanced maternal age multigravida 4+,        O4.523  third trimester (37 yrs)  Obesity complicating pregnancy, third          O99.213  trimester (BMI 32)  Marginal insertion of umbilical cord affecting O43.193  management of mother in third trimester  Encounter for other antenatal screening        Z36.2  follow-up  [redacted] weeks gestation of pregnancy                Z3A.35  LR NIPS, Neg AFP ---------------------------------------------------------------------- Fetal Evaluation  Num Of Fetuses:  1  Fetal Heart Rate(bpm):  128  Cardiac Activity:       Observed  Presentation:           Cephalic  Placenta:                Anterior Right  P. Cord Insertion:      Marg insertion previously seen  Amniotic Fluid  AFI FV:      Within normal limits  AFI Sum(cm)     %Tile       Largest Pocket(cm)  12.48           40          3.73  RUQ(cm)       RLQ(cm)       LUQ(cm)        LLQ(cm)  3.73          2.7           2.74           3.31 ---------------------------------------------------------------------- Biophysical Evaluation  Amniotic F.V:   Within normal limits       F. Tone:        Observed  F. Movement:    Observed                   Score:          8/8  F. Breathing:   Observed ---------------------------------------------------------------------- Biometry  BPD:      88.6  mm     G. Age:  35w 6d         57  %    CI:        71.41   %    70 - 86                                                          FL/HC:      21.5   %    20.1 - 22.1  HC:      333.9  mm     G. Age:  38w 1d         75  %    HC/AC:      1.05        0.93 - 1.11  AC:      317.8  mm     G. Age:  35w 5d         55  %    FL/BPD:     81.0   %    71 - 87  FL:       71.8  mm     G. Age:  36w 5d         69  %    FL/AC:      22.6   %    20 - 24  LV:        2.6  mm  Est. FW:    2891  gm      6 lb 6 oz     62  % ---------------------------------------------------------------------- OB History  Gravidity:    5         Term:   3        Prem:   0        SAB:   1  TOP:  0       Ectopic:  0        Living: 4 ---------------------------------------------------------------------- Gestational Age  LMP:           37w 2d        Date:  10/11/22                  EDD:   07/18/23  U/S Today:     36w 4d                                        EDD:   07/23/23  Best:          35w 6d     Det. By:  U/S C R L  (12/10/22)    EDD:   07/28/23 ---------------------------------------------------------------------- Anatomy  Cranium:               Appears normal         Aortic Arch:            Previously seen  Cavum:                 Appears normal         Ductal Arch:            Previously seen   Ventricles:            Appears normal         Diaphragm:              Previously seen  Choroid Plexus:        Previously seen        Stomach:                Appears normal, left                                                                        sided  Cerebellum:            Previously seen        Abdomen:                Previously seen  Posterior Fossa:       Previously seen        Abdominal Wall:         Previously seen  Face:                  Orbits and profile     Cord Vessels:           Previously seen                         previously seen  Lips:                  Previously seen        Kidneys:                Appear normal  Thoracic:              Previously seen        Bladder:  Appears normal  Heart:                 Appears normal         Spine:                  Previously seen                         (4CH, axis, and                         situs)  RVOT:                  Previously seen        Upper Extremities:      Previously seen  LVOT:                  Previously seen        Lower Extremities:      Previously seen  Other:  Fetal anatomic survey complete on prior scans. ---------------------------------------------------------------------- Cervix Uterus Adnexa  Cervix  Not visualized (advanced GA >24wks)  Uterus  No abnormality visualized.  Right Ovary  Not visualized.  Left Ovary  Not visualized.  Cul De Sac  No free fluid seen.  Adnexa  No abnormality visualized ---------------------------------------------------------------------- Comments  Lagina Thorson is currently at 35 weeks and 6 days.  She has been followed due to maternal obesity, advanced  maternal age (38 years old), and gestational diabetes.  The patient reports that she was told that a prescription would  be sent for insulin  following her prenatal visit last week.  However, she reports that the prescription was never sent.  She reports that her fasting fingerstick values have been in  the low 90s range and her 2-hour  postprandial fingerstick  values are all less than 125.  On today's exam, the overall EFW of 6 pounds 6 ounces  measures at the 62nd percentile for her gestational age.  There was normal amniotic fluid noted with a total AFI of  12.48 cm.  A BPP performed today was 8 out of 8.  The patient was advised to continue to monitor her fingerstick  values on a daily basis.  Should her glycemic control be mostly within normal limits,  delivery will be recommended at around 39 weeks.  However should her glycemic control be poor, delivery may  be considered at around 37 weeks.  She will return in 1 week for another BPP.  The patient stated that all of her questions were answered  today.  A total of 20 minutes was spent counseling and coordinating  the care for this patient.  Greater than 50% of the time was  spent in direct face-to-face contact. ----------------------------------------------------------------------                   Sal Crass, MD Electronically Signed Final Report   06/29/2023 01:39 pm ----------------------------------------------------------------------   US  MFM FETAL BPP WO NON STRESS Result Date: 06/29/2023 ----------------------------------------------------------------------  OBSTETRICS REPORT                       (Signed Final 06/29/2023 01:39 pm) ---------------------------------------------------------------------- Patient Info  ID #:       [id number]                          D.O.B.:  20-Dec-1985 (37 yrs)(F)  Name:  April Boone              Visit Date: 06/29/2023 09:13 am ---------------------------------------------------------------------- Performed By  Attending:        Sal Crass MD         Ref. Address:     [address]                                                             [place], [place]                                                             [address]  Performed By:     Elsie Halo BS      Location[facility]                                  [facility]  Referred By:      [facility] ---------------------------------------------------------------------- Orders  #  Description                           Code        Ordered By  1  US  MFM OB FOLLOW UP                   [id number]    CORENTHIAN                                                       BOOKER  2  US  MFM FETAL BPP WO NON               19147.82    [facility]     STRESS ----------------------------------------------------------------------  #  Order #  Accession #                Episode #  1  [id number]                   [id number]                 [id number]  2  [id number]                   [id number]                 [id number] ---------------------------------------------------------------------- Indications  Gestational diabetes in pregnancy, insulin      O24.414  controlled  Advanced maternal age multigravida 74+,        O56.523  third trimester (37 yrs)  Obesity complicating pregnancy, third          O99.213  trimester (BMI 32)  Marginal insertion of umbilical cord affecting O43.193  management of mother in third trimester  Encounter for other antenatal screening        Z36.2  follow-up  [redacted] weeks gestation of pregnancy                Z3A.35  LR NIPS, Neg AFP ---------------------------------------------------------------------- Fetal Evaluation  Num Of Fetuses:         1  Fetal Heart Rate(bpm):  128  Cardiac Activity:       Observed  Presentation:           Cephalic  Placenta:               Anterior Right  P. Cord Insertion:      Marg insertion previously seen  Amniotic Fluid  AFI FV:      Within normal limits  AFI Sum(cm)     %Tile       Largest Pocket(cm)  12.48           40          3.73  RUQ(cm)       RLQ(cm)       LUQ(cm)        LLQ(cm)  3.73          2.7           2.74           3.31  ---------------------------------------------------------------------- Biophysical Evaluation  Amniotic F.V:   Within normal limits       F. Tone:        Observed  F. Movement:    Observed                   Score:          8/8  F. Breathing:   Observed ---------------------------------------------------------------------- Biometry  BPD:      88.6  mm     G. Age:  35w 6d         57  %    CI:        71.41   %    70 - 86                                                          FL/HC:      21.5   %    20.1 - 22.1  HC:  333.9  mm     G. Age:  38w 1d         75  %    HC/AC:      1.05        0.93 - 1.11  AC:      317.8  mm     G. Age:  35w 5d         55  %    FL/BPD:     81.0   %    71 - 87  FL:       71.8  mm     G. Age:  36w 5d         69  %    FL/AC:      22.6   %    20 - 24  LV:        2.6  mm  Est. FW:    2891  gm      6 lb 6 oz     62  % ---------------------------------------------------------------------- OB History  Gravidity:    5         Term:   3        Prem:   0        SAB:   1  TOP:          0       Ectopic:  0        Living: 4 ---------------------------------------------------------------------- Gestational Age  LMP:           37w 2d        Date:  10/11/22                  EDD:   07/18/23  U/S Today:     36w 4d                                        EDD:   07/23/23  Best:          35w 6d     Det. By:  U/S C R L  (12/10/22)    EDD:   07/28/23 ---------------------------------------------------------------------- Anatomy  Cranium:               Appears normal         Aortic Arch:            Previously seen  Cavum:                 Appears normal         Ductal Arch:            Previously seen  Ventricles:            Appears normal         Diaphragm:              Previously seen  Choroid Plexus:        Previously seen        Stomach:                Appears normal, left  sided  Cerebellum:            Previously seen        Abdomen:                 Previously seen  Posterior Fossa:       Previously seen        Abdominal Wall:         Previously seen  Face:                  Orbits and profile     Cord Vessels:           Previously seen                         previously seen  Lips:                  Previously seen        Kidneys:                Appear normal  Thoracic:              Previously seen        Bladder:                Appears normal  Heart:                 Appears normal         Spine:                  Previously seen                         (4CH, axis, and                         situs)  RVOT:                  Previously seen        Upper Extremities:      Previously seen  LVOT:                  Previously seen        Lower Extremities:      Previously seen  Other:  Fetal anatomic survey complete on prior scans. ---------------------------------------------------------------------- Cervix Uterus Adnexa  Cervix  Not visualized (advanced GA >24wks)  Uterus  No abnormality visualized.  Right Ovary  Not visualized.  Left Ovary  Not visualized.  Cul De Sac  No free fluid seen.  Adnexa  No abnormality visualized ---------------------------------------------------------------------- Comments  April Boone is currently at 35 weeks and 6 days.  She has been followed due to maternal obesity, advanced  maternal age (38 years old), and gestational diabetes.  The patient reports that she was told that a prescription would  be sent for insulin  following her prenatal visit last week.  However, she reports that the prescription was never sent.  She reports that her fasting fingerstick values have been in  the low 90s range and her 2-hour postprandial fingerstick  values are all less than 125.  On today's exam, the overall EFW of 6 pounds 6 ounces  measures at the 62nd percentile for her gestational age.  There was normal amniotic fluid noted with a total AFI of  12.48 cm.  A BPP performed today was 8 out of 8.  The patient was advised  to continue to monitor  her fingerstick  values on a daily basis.  Should her glycemic control be mostly within normal limits,  delivery will be recommended at around 39 weeks.  However should her glycemic control be poor, delivery may  be considered at around 37 weeks.  She will return in 1 week for another BPP.  The patient stated that all of her questions were answered  today.  A total of 20 minutes was spent counseling and coordinating  the care for this patient.  Greater than 50% of the time was  spent in direct face-to-face contact. 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 Will treat headache with ibuprofen  - Start lisinopril 10 mg for BP management. Will hold on PO Labetalol since HR is intermittently <60. Other option would be hydralazine for BP but I would be concerned about her ability to maintain the timing of dosing as an outpt as new mom and nursing.  - Continue IV labetalol protocol  - Lasix 20 BID started. K is 4.2. She received IV dose in the ED  2. 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 Until that time, due to the negative inotropic effect of Magnesium, I will do Keppra 1g Q12 IV x24 hours then switch to 500 mg PO BID. My current concern is that the Magnesium will worsen her cardiac state if she does have cardiomyopathy. Additionally she has pulmonary edema which is a relative contraindication to Magnesium - If echo shows it normal EF, then we can consider switching to Magnesium depending on her UOP and volume status.  -  Strict I/O ordered - Will do Tele until echo resulted  3. Routine PP - Motrin  for pain - Lovenox/SCDs ordered - No tylenol  due to lfts.   Orders and plan of care determined by Dr. Vallarie Gauze in consultation with ER physician Dr. Adrain Alar at initial patient presentation. I assumed care of this patient, conducted the history and physical exam as documented.   Marci Setter, MD, FACOG & Lacey Pian, MD, FACOG Obstetrician & Gynecologist, [facility] for [facility], [facility]   Addendum: Communicated with cardiology Dr. Leandrew Proctor regarding her care plan. Will update her meds as follows: - IV lasix 40 mg now, titrate accordingly based on am labs and response to diruesis - agree with lisinopril 10 mg daily - add metoprolol 25 mg daily to start tomorrow morning - echo to be done in am  Marci Setter, MD, FACOG Obstetrician & Gynecologist, [facility] for [employer], [facility]

## 2023-07-18 NOTE — ED Provider Notes (Signed)
 [place] [facility] DEPARTMENT AT [facility] Provider Note   CSN: [id number] Arrival date & time: 07/18/23  1551     History  No chief complaint on file.   Florestine Bralley is a 38 y.o. female.  HPI   38 year old G5 P4-0-1-5 female with medical history significant for gestational diabetes mellitus, recent vaginal delivery at 37 weeks 3 days who underwent induction of labor complicated by prolonged decelerations requiring vacuum-assisted vaginal delivery presenting to the emergency department sudden onset cough, chest pain, shortness of breath with associated bilateral lower extremity swelling, left greater than right.  She delivered via vacuum-assisted vaginal delivery on 07/11/2023.  No fevers, no history of heart failure or ACS or PE.  Endorses difficulty lying flat with worsening shortness of breath.  Home Medications Prior to Admission medications   Medication Sig Start Date End Date Taking? Authorizing Provider  acetaminophen  (TYLENOL ) 500 MG tablet Take 2 tablets (1,000 mg total) by mouth every 6 (six) hours as needed for headache. 07/11/23   Darrow End, MD  benzocaine -Menthol  (DERMOPLAST) 20-0.5 % AERO Apply 1 Application topically as needed for irritation (perineal discomfort). 07/11/23   Leveque, Alyssa, MD  dibucaine (NUPERCAINAL) 1 % OINT Place 1 Application rectally as needed for hemorrhoids. 07/11/23   Leveque, Alyssa, MD  ibuprofen  (ADVIL ) 600 MG tablet Take 1 tablet (600 mg total) by mouth every 6 (six) hours. 07/11/23   Leveque, Alyssa, MD  Prenatal Vit-Fe Fumarate-FA (PRENATAL VITAMINS) 28-0.8 MG TABS Take 1 tablet by mouth daily. 02/10/23   Constant, Peggy, MD  Vitamin D , Cholecalciferol , 25 MCG (1000 UT) CAPS Take 1 tablet by mouth daily. 06/07/23   Davis, Devon E, PA-C  witch hazel-glycerin  (TUCKS) pad Apply 1 Application topically as needed for hemorrhoids. 07/11/23   Leveque, Alyssa, MD      Allergies    Patient has no known allergies.    Review of  Systems   Review of Systems  All other systems reviewed and are negative.   Physical Exam Updated Vital Signs BP (!) 175/97   Pulse 79   Temp 98.4 F (36.9 C)   Resp 18   LMP 10/11/2022 (Exact Date)   SpO2 100%  Physical Exam Vitals and nursing note reviewed.  Constitutional:      General: She is not in acute distress.    Appearance: She is well-developed.  HENT:     Head: Normocephalic and atraumatic.  Eyes:     Conjunctiva/sclera: Conjunctivae normal.  Cardiovascular:     Rate and Rhythm: Normal rate and regular rhythm.  Pulmonary:     Effort: Pulmonary effort is normal. No respiratory distress.     Breath sounds: Normal breath sounds.  Abdominal:     Palpations: Abdomen is soft.     Tenderness: There is no abdominal tenderness.  Musculoskeletal:        General: Swelling present.     Cervical back: Neck supple.     Right lower leg: Edema present.     Left lower leg: Edema present.  Skin:    General: Skin is warm and dry.     Capillary Refill: Capillary refill takes less than 2 seconds.  Neurological:     Mental Status: She is alert.  Psychiatric:        Mood and Affect: Mood normal.     ED Results / Procedures / Treatments   Labs (all labs ordered are listed, but only abnormal results are displayed) Labs Reviewed  CBC WITH DIFFERENTIAL/PLATELET - Abnormal; Notable  for the following components:      Result Value   RBC 3.83 (*)    Hemoglobin 10.2 (*)    HCT 32.5 (*)    nRBC 0.4 (*)    Abs Immature Granulocytes 0.23 (*)    All other components within normal limits  COMPREHENSIVE METABOLIC PANEL WITH GFR - Abnormal; Notable for the following components:   CO2 18 (*)    Calcium  8.4 (*)    Total Protein 6.0 (*)    Albumin 2.8 (*)    AST 119 (*)    ALT 157 (*)    All other components within normal limits  BRAIN NATRIURETIC PEPTIDE - Abnormal; Notable for the following components:   B Natriuretic Peptide 417.5 (*)    All other components within normal  limits  URINALYSIS, ROUTINE W REFLEX MICROSCOPIC - Abnormal; Notable for the following components:   Hgb urine dipstick MODERATE (*)    Protein, ur 100 (*)    Leukocytes,Ua SMALL (*)    All other components within normal limits  RESP PANEL BY RT-PCR (RSV, FLU A&B, COVID)  [id number]  COMPREHENSIVE METABOLIC PANEL WITH GFR  TROPONIN I (HIGH SENSITIVITY)    EKG EKG Interpretation Date/Time:  Sunday Jul 18 2023 16:19:35 EDT Ventricular Rate:  81 PR Interval:  136 QRS Duration:  70 QT Interval:  376 QTC Calculation: 436 R Axis:   68  Text Interpretation: Normal sinus rhythm Nonspecific ST abnormality Abnormal ECG No previous ECGs available Confirmed by Rosealee Concha ([id number]) on 07/18/2023 5:21:27 PM  Radiology CT Angio Chest PE W and/or Wo Contrast Result Date: 07/18/2023 CLINICAL DATA:  Pulmonary embolism (PE) suspected, high prob. Chest pain, cough EXAM: CT ANGIOGRAPHY CHEST WITH CONTRAST TECHNIQUE: Multidetector CT imaging of the chest was performed using the standard protocol during bolus administration of intravenous contrast. Multiplanar CT image reconstructions and MIPs were obtained to evaluate the vascular anatomy. RADIATION DOSE REDUCTION: This exam was performed according to the departmental dose-optimization program which includes automated exposure control, adjustment of the mA and/or kV according to patient size and/or use of iterative reconstruction technique. CONTRAST:  75mL OMNIPAQUE  IOHEXOL  350 MG/ML SOLN COMPARISON:  None Available. FINDINGS: Cardiovascular: Cardiomegaly. Aorta normal caliber. No filling defects in the pulmonary arteries to suggest pulmonary emboli. Mediastinum/Nodes: No mediastinal, hilar, or axillary adenopathy. Trachea and esophagus are unremarkable. Thyroid unremarkable. Lungs/Pleura: Bilateral airspace disease. Moderate right pleural effusion and small left pleural effusion. Upper Abdomen: No acute findings Musculoskeletal: Chest wall soft tissues are  unremarkable. No acute bony abnormality. Review of the MIP images confirms the above findings. IMPRESSION: No evidence of pulmonary embolus. Mild cardiomegaly. Bilateral airspace disease and effusions. This could reflect edema or infection. Electronically Signed   By: Janeece Mechanic M.D.   On: 07/18/2023 19:26   VAS US  LOWER EXTREMITY VENOUS (DVT) (ONLY [facility] & [facility]) Result Date: 07/18/2023  Lower Venous DVT Study Patient Name:  MALIN SAMBRANO  Date of Exam:   07/18/2023 Medical Rec #: [id number]           Accession #:    [id number] Date of Birth: 07/12/1985            Patient Gender: F Patient Age:   57 years Exam Location:  [facility] Procedure:      VAS US  LOWER EXTREMITY VENOUS (DVT) Referring Phys: Rosealee Concha --------------------------------------------------------------------------------  Indications: Edema.  Risk Factors: Recently pregnant (delivered 07/10/2023). Limitations: Patient continuously coughing. Comparison Study: No previous exams Performing Technologist: Jody Hill RVT, RDMS  Examination Guidelines: A complete evaluation includes B-mode imaging, spectral Doppler, color Doppler, and power Doppler as needed of all accessible portions of each vessel. Bilateral testing is considered an integral part of a complete examination. Limited examinations for reoccurring indications may be performed as noted. The reflux portion of the exam is performed with the patient in reverse Trendelenburg.  +---------+---------------+---------+-----------+----------+--------------+ RIGHT    CompressibilityPhasicitySpontaneityPropertiesThrombus Aging +---------+---------------+---------+-----------+----------+--------------+ CFV      Full           No       Yes                                 +---------+---------------+---------+-----------+----------+--------------+ SFJ      Full                                                         +---------+---------------+---------+-----------+----------+--------------+ FV Prox  Full           No       Yes                                 +---------+---------------+---------+-----------+----------+--------------+ FV Mid   Full           No       Yes                                 +---------+---------------+---------+-----------+----------+--------------+ FV DistalFull           No       Yes                                 +---------+---------------+---------+-----------+----------+--------------+ PFV      Full                                                        +---------+---------------+---------+-----------+----------+--------------+ POP      Full           No       Yes                                 +---------+---------------+---------+-----------+----------+--------------+ PTV      Full                                                        +---------+---------------+---------+-----------+----------+--------------+ PERO     Full                                                        +---------+---------------+---------+-----------+----------+--------------+   +---------+---------------+---------+-----------+----------+--------------+  LEFT     CompressibilityPhasicitySpontaneityPropertiesThrombus Aging +---------+---------------+---------+-----------+----------+--------------+ CFV      Full           No       Yes                                 +---------+---------------+---------+-----------+----------+--------------+ SFJ      Full                                                        +---------+---------------+---------+-----------+----------+--------------+ FV Prox  Full           No       Yes                                 +---------+---------------+---------+-----------+----------+--------------+ FV Mid   Full           No       Yes                                  +---------+---------------+---------+-----------+----------+--------------+ FV DistalFull           No       Yes                                 +---------+---------------+---------+-----------+----------+--------------+ PFV      Full                                                        +---------+---------------+---------+-----------+----------+--------------+ POP      Full           No       Yes                                 +---------+---------------+---------+-----------+----------+--------------+ PTV      Full                                                        +---------+---------------+---------+-----------+----------+--------------+ PERO     Full                                                        +---------+---------------+---------+-----------+----------+--------------+    Summary: BILATERAL: - No evidence of deep vein thrombosis seen in the lower extremities, bilaterally. -No evidence of popliteal cyst, bilaterally. -Pulsatile doppler waveforms, bilaterally.  *See table(s) above for measurements and observations.    Preliminary     Procedures .Critical Care  Performed by: Rosealee Concha, MD Authorized by: Rosealee Concha, MD  Critical care provider statement:    Critical care time (minutes):  80   Critical care was time spent personally by me on the following activities:  Development of treatment plan with patient or surrogate, discussions with consultants, evaluation of patient's response to treatment, examination of patient, ordering and review of laboratory studies, ordering and review of radiographic studies, ordering and performing treatments and interventions, pulse oximetry, re-evaluation of patient's condition and review of old charts   Care discussed with: admitting provider       Medications Ordered in ED Medications  sodium chloride  flush (NS) 0.9 % injection 3 mL (has no administration in time range)  sodium chloride  flush (NS) 0.9  % injection 3 mL (has no administration in time range)  0.9 %  sodium chloride  infusion (has no administration in time range)  labetalol (NORMODYNE) injection 20 mg (has no administration in time range)    And  labetalol (NORMODYNE) injection 40 mg (has no administration in time range)    And  labetalol (NORMODYNE) injection 80 mg (has no administration in time range)    And  hydrALAZINE (APRESOLINE) injection 10 mg (has no administration in time range)  levETIRAcetam (KEPPRA) undiluted injection 1,000 mg (has no administration in time range)    Followed by  levETIRAcetam (KEPPRA) tablet 500 mg (has no administration in time range)  lisinopril (ZESTRIL) tablet 10 mg (has no administration in time range)  furosemide (LASIX) tablet 20 mg (has no administration in time range)  potassium chloride (KLOR-CON) packet 20 mEq (has no administration in time range)  iohexol  (OMNIPAQUE ) 350 MG/ML injection 75 mL (75 mLs Intravenous Contrast Given 07/18/23 1922)    ED Course/ Medical Decision Making/ A&P Clinical Course as of 07/18/23 1956  Sun Jul 18, 2023  1720 BP(!): 157/137 [JL]  1931 BP(!): 155/120 [JL]    Clinical Course User Index [JL] Rosealee Concha, MD                                 Medical Decision Making Amount and/or Complexity of Data Reviewed Labs: ordered.  Risk Prescription drug management. Decision regarding hospitalization.    38 year old G5 P4-0-1-5 female with medical history significant for gestational diabetes mellitus, recent vaginal delivery at 37 weeks 3 days who underwent induction of labor complicated by prolonged decelerations requiring vacuum-assisted vaginal delivery presenting to the emergency department sudden onset cough, chest pain, shortness of breath with associated bilateral lower extremity swelling, left greater than right.  She delivered via vacuum-assisted vaginal delivery on 07/11/2023.  No fevers, no history of heart failure or ACS or PE.  Endorses  difficulty lying flat with worsening shortness of breath.  On arrival, the patient was afebrile, not tachycardic or tachypneic, BP 157/137, saturating 99% on room air.  Sinus rhythm noted on cardiac telemetry.  DVT and PE workup initiated in triage due to patient presentation.  Additional differential considerations include preeclampsia and postpartum cardiomyopathy.  Initial EKG revealed sinus rhythm, nonspecific ST changes, ventricular rate 81, no STEMI.  I consulted obstetrics on-call, spoke with Dr. Alto Atta and subsequently Dr. Vallarie Gauze who recommended labetalol IV if patient's pressures remain significantly elevated and preeclampsia ranges. After discussion with OBGYN, initial IV labetalol was not administered because patient's blood pressures were not significantly at PreE with severe features levels however her blood pressure did climb in the emergency department and she was subsequently administered IV labetalol.   Labs: Urinalysis with proteinuria, no bacteria  seen, 11-20 RBCs, 11 Tobie WBCs, small leukocytes, negative nitrates, COVID, flu, RSV PCR testing negative, cardiac troponin negative, BNP elevated to 418, CBC without leukocytosis, mild anemia to 10.2, CMP with a non-anion gap acidosis with a bicarbonate of 18, anion gap of 9, LFTs elevated with an AST of 119, ALT of 157.  DVT ultrasound was performed which was negative for DVT.  CT angiogram PE study was performed and revealed no evidence of acute PE: IMPRESSION:  No evidence of pulmonary embolus.    Mild cardiomegaly. Bilateral airspace disease and effusions. This  could reflect edema or infection.   I consulted on-call cardiology, Dr. Leandrew Proctor will see the patient in consultation. Dr. Vallarie Gauze of OBGYN subsequently admitted the patient.   Final Clinical Impression(s) / ED Diagnoses Final diagnoses:  SOB (shortness of breath)  Pre-eclampsia in postpartum period    Rx / DC Orders ED Discharge Orders     None          Rosealee Concha, MD 07/18/23 1956

## 2023-07-18 NOTE — ED Provider Triage Note (Signed)
 Emergency Medicine Provider Triage Evaluation Note  April Boone , a 38 y.o. female  was evaluated in triage.  Pt complains of cough, shortness of breath and left leg swelling.  Pt is one week post partum   Review of Systems  Positive: Short of breath Negative: fever  Physical Exam  BP (!) 157/137 (BP Location: Right Arm)   Pulse 83   Temp 98.4 F (36.9 C)   Resp 20   LMP 10/11/2022 (Exact Date)   SpO2 99%  Gen:   Awake, no distress   Resp:  Normal effort  MSK:   Moves extremities without difficulty  Other:    Medical Decision Making  Medically screening exam initiated at 4:27 PM.  Appropriate orders placed.  April Boone was informed that the remainder of the evaluation will be completed by another provider, this initial triage assessment does not replace that evaluation, and the importance of remaining in the ED until their evaluation is complete.     Sandi Crosby, PA-C 07/18/23 1628

## 2023-07-19 ENCOUNTER — Inpatient Hospital Stay (HOSPITAL_COMMUNITY)

## 2023-07-19 DIAGNOSIS — O1495 Unspecified pre-eclampsia, complicating the puerperium: Principal | ICD-10-CM

## 2023-07-19 DIAGNOSIS — I428 Other cardiomyopathies: Secondary | ICD-10-CM

## 2023-07-19 DIAGNOSIS — R008 Other abnormalities of heart beat: Secondary | ICD-10-CM

## 2023-07-19 DIAGNOSIS — I501 Left ventricular failure: Secondary | ICD-10-CM

## 2023-07-19 DIAGNOSIS — R0602 Shortness of breath: Secondary | ICD-10-CM

## 2023-07-19 DIAGNOSIS — I5089 Other heart failure: Secondary | ICD-10-CM

## 2023-07-19 LAB — COMPREHENSIVE METABOLIC PANEL WITH GFR
ALT: 222 U/L — ABNORMAL HIGH (ref 0–44)
ALT: 196 U/L — ABNORMAL HIGH (ref 0–44)
AST: 152 U/L — ABNORMAL HIGH (ref 15–41)
AST: 92 U/L — ABNORMAL HIGH (ref 15–41)
Albumin: 2.8 g/dL — ABNORMAL LOW (ref 3.5–5.0)
Albumin: 2.8 g/dL — ABNORMAL LOW (ref 3.5–5.0)
Alkaline Phosphatase: 85 U/L (ref 38–126)
Alkaline Phosphatase: 78 U/L (ref 38–126)
Anion gap: 10 (ref 5–15)
Anion gap: 10 (ref 5–15)
BUN: 9 mg/dL (ref 6–20)
BUN: 8 mg/dL (ref 6–20)
CO2: 22 mmol/L (ref 22–32)
CO2: 22 mmol/L (ref 22–32)
Calcium: 8.3 mg/dL — ABNORMAL LOW (ref 8.9–10.3)
Calcium: 8 mg/dL — ABNORMAL LOW (ref 8.9–10.3)
Chloride: 111 mmol/L (ref 98–111)
Chloride: 109 mmol/L (ref 98–111)
Creatinine, Ser: 0.58 mg/dL (ref 0.44–1.00)
Creatinine, Ser: 0.69 mg/dL (ref 0.44–1.00)
GFR, Estimated: >60 mL/min (ref >60)
GFR, Estimated: >60 mL/min (ref >60)
Glucose, Bld: 78 mg/dL (ref 70–99)
Glucose, Bld: 81 mg/dL (ref 70–99)
Potassium: 3.1 mmol/L — ABNORMAL LOW (ref 3.5–5.1)
Potassium: 3.2 mmol/L — ABNORMAL LOW (ref 3.5–5.1)
Sodium: 143 mmol/L (ref 135–145)
Sodium: 141 mmol/L (ref 135–145)
Total Bilirubin: 0.6 mg/dL (ref 0.0–1.2)
Total Bilirubin: 0.5 mg/dL (ref 0.0–1.2)
Total Protein: 5.9 g/dL — ABNORMAL LOW (ref 6.5–8.1)
Total Protein: 6 g/dL — ABNORMAL LOW (ref 6.5–8.1)

## 2023-07-19 LAB — BLOOD GAS, VENOUS
Acid-Base Excess: 0.8 mmol/L (ref 0.0–2.0)
Bicarbonate: 24.2 mmol/L (ref 20.0–28.0)
O2 Saturation: 97 %
Patient temperature: 36.9
pCO2, Ven: 34 mmHg — ABNORMAL LOW (ref 44–60)
pH, Ven: 7.46 — ABNORMAL HIGH (ref 7.25–7.43)
pO2, Ven: 74 mmHg — ABNORMAL HIGH (ref 32–45)

## 2023-07-19 LAB — ECHOCARDIOGRAM COMPLETE
Area-P 1/2: 3.79 cm2
S' Lateral: 2.5 cm
Weight: 2796.8 [oz_av]

## 2023-07-19 LAB — IRON AND TIBC
Iron: 40 ug/dL (ref 28–170)
Saturation Ratios: 8 % — ABNORMAL LOW (ref 10.4–31.8)
TIBC: 505 ug/dL — ABNORMAL HIGH (ref 250–450)
UIBC: 465 ug/dL

## 2023-07-19 LAB — TRANSFERRIN: Transferrin: 365 mg/dL (ref 192–382)

## 2023-07-19 LAB — MAGNESIUM
Magnesium: 1.7 mg/dL (ref 1.7–2.4)
Magnesium: 1.6 mg/dL — ABNORMAL LOW (ref 1.7–2.4)

## 2023-07-19 LAB — FERRITIN: Ferritin: 10 ng/mL — ABNORMAL LOW (ref 11–307)

## 2023-07-19 MED ORDER — FUROSEMIDE 40 MG PO TABS
40.0000 mg | ORAL_TABLET | Freq: Two times a day (BID) | ORAL | Status: DC
  Administered 2023-07-19 – 2023-07-20 (×2): 40 mg via ORAL
  Filled 2023-07-19 (×2): qty 1

## 2023-07-19 MED ORDER — LOSARTAN POTASSIUM 25 MG PO TABS
25.0000 mg | ORAL_TABLET | Freq: Every day | ORAL | Status: DC
  Administered 2023-07-19 – 2023-07-21 (×3): 25 mg via ORAL
  Filled 2023-07-19 (×3): qty 1

## 2023-07-19 MED ORDER — POTASSIUM CHLORIDE 20 MEQ PO PACK
40.0000 meq | PACK | Freq: Two times a day (BID) | ORAL | Status: DC
  Administered 2023-07-19 – 2023-07-21 (×4): 40 meq via ORAL
  Filled 2023-07-19 (×4): qty 2

## 2023-07-19 MED ORDER — OXYCODONE HCL 5 MG PO TABS
5.0000 mg | ORAL_TABLET | ORAL | Status: DC | PRN
  Administered 2023-07-19 – 2023-07-20 (×2): 5 mg via ORAL
  Filled 2023-07-19 (×2): qty 1

## 2023-07-19 MED ORDER — FUROSEMIDE 20 MG PO TABS
20.0000 mg | ORAL_TABLET | Freq: Two times a day (BID) | ORAL | Status: DC
  Administered 2023-07-19: 20 mg via ORAL
  Filled 2023-07-19: qty 1

## 2023-07-19 NOTE — Progress Notes (Signed)
  Echocardiogram 2D Echocardiogram has been performed.  Fain Home RDCS 07/19/2023, 8:35 AM

## 2023-07-19 NOTE — Progress Notes (Signed)
 Rounding Note    Patient Name: April Boone Date of Encounter: 07/19/2023  [facility] Cardiologist: None   Subjective   Consult from overnight reviewed, see changes to plan below. Charted as >4L urine output. She isn't sure how much urine she is having. Feels tired, breathing not at baseline but improving. Discussed her echo together today. Reviewed plans for medications, see below.   Inpatient Medications    Scheduled Meds:  enoxaparin  (LOVENOX ) injection  40 mg Subcutaneous Q24H   furosemide   20 mg Oral BID   levETIRAcetam   500 mg Oral BID   losartan   25 mg Oral Daily   potassium chloride   20 mEq Oral BID   sodium chloride  flush  3 mL Intravenous Q12H   Continuous Infusions:  sodium chloride      PRN Meds: sodium chloride , labetalol  **AND** labetalol  **AND** labetalol  **AND** hydrALAZINE  **AND** Measure blood pressure, ibuprofen , oxyCODONE , sodium chloride  flush   Vital Signs    Vitals:   07/19/23 0415 07/19/23 0515 07/19/23 0608 07/19/23 0855  BP:    129/76  Pulse:    85  Resp:    (!) 40  Temp:    98.1 F (36.7 C)  TempSrc:    Oral  SpO2: 97% 96%  97%  Weight:   79.3 kg     Intake/Output Summary (Last 24 hours) at 07/19/2023 1112 Last data filed at 07/19/2023 0700 Gross per 24 hour  Intake 456 ml  Output 4825 ml  Net -4369 ml      07/19/2023    6:08 AM 07/18/2023   10:40 PM 07/09/2023    8:09 AM  Last 3 Weights  Weight (lbs) 174 lb 12.8 oz 180 lb 12.8 oz 187 lb 12.8 oz  Weight (kg) 79.289 kg 82.01 kg 85.186 kg      Telemetry    SR - Personally Reviewed  Physical Exam   GEN: No acute distress.  On O2 by [place] Neck: No JVD Cardiac: RRR, no murmurs, rubs, or gallops.  Respiratory: largely clear, mildly diminished at bases GI: Soft, nontender, non-distended  MS: No edema; No deformity. 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 No history of pre-eclampsia with this or prior pregnancies before presentation -was very hypertensive on presentation, consistent with post partum pre-eclampsia. Had headache on presentation. On keppra  for seizure prophylaxis. LFTs elevated on presentation -delivered on 07/11/23 at [redacted]w[redacted]d for poorly controlled gestational diabetes -reports several days of worsening orthopnea, LE edema, cough, and shortness of breath post delivery -CTPE negative -imaging with pulmonary edema, cardiomegaly. Elevated BNP -echo personally reviewed. EF is preserved, RV function/size is normal, IVC normal with RAP 3 mmHg. There is a possibly trivial/very small pericardial effusion (images are difficult) but no concern clinically or by echo for tamponade. Her diastolic parameters are now normal, though she has been significantly diuresed. Together, this points to acute diastolic heart failure/pulmonary edema in the setting of pre-eclampsia (rather than typical diastolic heart failure due to underlying diastolic dysfunction) -continue diuresis. Currently ordered as oral diuretic, will monitor response to see if we need to return to IV dosing. Would attempt to wean O2, ambulate later today to monitor O2 sats -started on lisinopril  this admission. Would prefer entresto given heart failure symptoms if BP  allows. Will stop lisinopril  (one dose PM 5/18), start losartan --updated, this was based on her discharge plan noting bottle feeding/not breast feeding, but on our discussion today she was planning to pump. Will review meds with pharmacy team.Will need birth control discussion given ARB and risk of pregnancy, she told me she does not have current plans for future pregnancy. -would consider SLGT2i given findings above, history of gestational diabetes, though if she is planning to  breastfeed there is no data on these medications in breastmilk -hypokalemia to 3.1 this AM, repletion ordered -ordered for metoprolol  this AM. HR has been 50s-80s. Will hold on this given normal EF -admission weight 82 kg, current weight 79.3 kg. Charted net negative 4.3 L, though minimal intake charted     Signed, Sheryle Donning, MD  07/19/2023, 11:12 AM

## 2023-07-19 NOTE — Progress Notes (Addendum)
 Gynecology Progress Note  Admission Date: 07/18/2023 Current Date: 07/19/2023 7:19 AM  April Boone is a 38 y.o. 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 Resting in bed. Less cough.  On 2L [place]   Objective:  Patient Vitals for the past 12 hrs:  BP Temp Temp src Pulse Resp SpO2 Weight  07/19/23 0608 -- -- -- -- -- -- 79.3 kg  07/19/23 0515 -- -- -- -- -- 96 % --  07/19/23 0415 -- -- -- -- -- 97 % --  07/19/23 0411 136/81 98 F (36.7 C) Oral 79 (!) 30 -- --  07/19/23 0400 -- -- -- -- -- 98 % --  07/19/23 0245 -- -- -- -- (!) 29 -- --  07/19/23 0200 -- -- -- -- -- 97 % --  07/19/23 0131 -- -- -- -- -- 96 % --  07/19/23 0115 -- 98.5 F (36.9 C) Oral -- -- -- --  07/19/23 0105 -- -- -- -- -- 96 % --  07/19/23 0100 -- -- -- -- -- 95 % --  07/19/23 0055 -- -- -- -- -- 90 % --  07/19/23 0051 133/66 98.3 F (36.8 C) Oral 75 (!) 30 94 % --  07/19/23 0000 -- -- -- -- (!) 32 93 % --  07/18/23 2247 (!) 149/79 98.1 F (36.7 C) Oral 69 (!) 36 95 % --  07/18/23 2240 -- -- -- -- -- -- 82 kg  07/18/23 2200 -- -- -- -- -- 94 % --  07/18/23 2149 139/84 98.4 F (36.9 C) Oral 65 (!) 35 -- --  07/18/23 2131 (!) 170/88 -- -- 69 (!) 30 -- --  07/18/23 1945 (!) 175/97 -- -- 79 18 100 % --   Physical exam: General appearance: alert, cooperative, and appears stated age, not coughing as much as last night Lungs: clear to auscultation bilaterally Heart: regular rate Abdomen: soft, nontender, nondistended GU: deferred3 Extremities: no lower extremity edema     Intake/Output Summary (Last 24 hours) at 07/19/2023 8413 Last data filed at 07/19/2023 0700 Gross per 24 hour  Intake 456 ml  Output 4825 ml  Net -4369 ml    Medications Current Facility-Administered Medications  Medication Dose Route Frequency Provider Last Rate Last Admin   0.9 %  sodium chloride  infusion  250 mL Intravenous PRN Lacey Pian, MD       enoxaparin  (LOVENOX ) injection 40 mg  40 mg Subcutaneous Q24H Lacey Pian, MD   40 mg at 07/18/23 2222   labetalol  (NORMODYNE ) injection 20 mg  20 mg Intravenous PRN Lacey Pian, MD       And   labetalol  (NORMODYNE ) injection 40 mg  40 mg Intravenous PRN Lacey Pian, MD       And   labetalol  (NORMODYNE ) injection 80 mg  80 mg Intravenous PRN Lacey Pian, MD       And   hydrALAZINE  (APRESOLINE ) injection 10 mg  10 mg Intravenous PRN Lacey Pian, MD       ibuprofen  (ADVIL ) tablet 600 mg  600 mg Oral Q6H PRN Lacey Pian, MD   600 mg at 07/19/23 0515   levETIRAcetam  (KEPPRA ) undiluted injection 1,000 mg  1,000 mg Intravenous Q12H Lacey Pian, MD   1,000 mg at 07/18/23 2007   Followed by   levETIRAcetam  (KEPPRA ) tablet 500 mg  500 mg Oral BID Lacey Pian, MD       lisinopril  (ZESTRIL ) tablet 10 mg  10  mg Oral Daily Lacey Pian, MD   10 mg at 07/18/23 2007   metoprolol  tartrate (LOPRESSOR ) 25 mg/10 mL oral suspension 25 mg  25 mg Oral Daily Marci Setter, MD       oxyCODONE  (Oxy IR/ROXICODONE ) immediate release tablet 5 mg  5 mg Oral Q4H PRN Marci Setter, MD       potassium chloride  (KLOR-CON ) packet 20 mEq  20 mEq Oral BID Lacey Pian, MD   20 mEq at 07/18/23 2241   sodium chloride  flush (NS) 0.9 % injection 3 mL  3 mL Intravenous Q12H Lacey Pian, MD   3 mL at 07/18/23 2200   sodium chloride  flush (NS) 0.9 % injection 3 mL  3 mL Intravenous PRN Lacey Pian, MD          Labs  Recent Labs  Lab 07/18/23 1715  WBC 8.0  HGB 10.2*  HCT 32.5*  PLT 240    Recent Labs  Lab 07/18/23 1715 07/19/23 0436  NA 138 143  K 4.2 3.1*  CL 111 111  CO2 18* 22  BUN 15 9  CREATININE 0.63 0.58  CALCIUM  8.4* 8.3*  PROT 6.0* 5.9*  BILITOT 1.1 0.6  ALKPHOS 77 85  ALT 157* 222*  AST 119* 152*  GLUCOSE 80 78      Assessment & Plan:  Postpartum preeclampsia:  LFTs elevated and intermittent, unsustained severe range blood pressures and mild headache.  Bps now improved. 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 Susepcted peripartum cardiomyopathy: Cardiology consulted last night, see note, appreciate recs. On 2L [place]  -- continue lisinopril  10 mg daily  -- continue diuresis as above  -- metoprolol  25 mg daily  -- BID labs while aggressively diuresing  -- if echo shows it normal EF, then we can consider switching to Magnesium   depending on her UOP and volume status -- strict ins/outs  -- telemetry monitoring  -- 2 L PO fluid restriction  -- daily weights (she is down 2.7 kg since last night)  3. Routine PP - Motrin  for pain - Lovenox /SCDs ordered - No tylenol  due to lfts.   Code Status: Full Code   Marci Setter, MD, FACOG Obstetrician & Gynecologist, [facility], [facility]

## 2023-07-20 DIAGNOSIS — I10 Essential (primary) hypertension: Secondary | ICD-10-CM

## 2023-07-20 LAB — COMPREHENSIVE METABOLIC PANEL WITH GFR
ALT: 142 U/L — ABNORMAL HIGH (ref 0–44)
AST: 42 U/L — ABNORMAL HIGH (ref 15–41)
Albumin: 2.7 g/dL — ABNORMAL LOW (ref 3.5–5.0)
Alkaline Phosphatase: 68 U/L (ref 38–126)
Anion gap: 9 (ref 5–15)
BUN: 7 mg/dL (ref 6–20)
CO2: 23 mmol/L (ref 22–32)
Calcium: 8 mg/dL — ABNORMAL LOW (ref 8.9–10.3)
Chloride: 110 mmol/L (ref 98–111)
Creatinine, Ser: 0.61 mg/dL (ref 0.44–1.00)
GFR, Estimated: >60 mL/min (ref >60)
Glucose, Bld: 91 mg/dL (ref 70–99)
Potassium: 3.5 mmol/L (ref 3.5–5.1)
Sodium: 142 mmol/L (ref 135–145)
Total Bilirubin: 0.3 mg/dL (ref 0.0–1.2)
Total Protein: 5.7 g/dL — ABNORMAL LOW (ref 6.5–8.1)

## 2023-07-20 LAB — MAGNESIUM: Magnesium: 1.7 mg/dL (ref 1.7–2.4)

## 2023-07-20 MED ORDER — CEFAZOLIN SODIUM-DEXTROSE 2-4 GM/100ML-% IV SOLN
INTRAVENOUS | Status: AC
  Filled 2023-07-20: qty 100

## 2023-07-20 MED ORDER — GUAIFENESIN-DM 100-10 MG/5ML PO SYRP
5.0000 mL | ORAL_SOLUTION | ORAL | Status: DC | PRN
  Administered 2023-07-20 – 2023-07-21 (×3): 5 mL via ORAL
  Filled 2023-07-20 (×5): qty 5

## 2023-07-20 MED ORDER — FUROSEMIDE 40 MG PO TABS
40.0000 mg | ORAL_TABLET | Freq: Every day | ORAL | Status: DC
  Administered 2023-07-21: 40 mg via ORAL
  Filled 2023-07-20 (×2): qty 1

## 2023-07-20 NOTE — Progress Notes (Signed)
 Rounding Note    Patient Name: April Boone Date of Encounter: 07/20/2023  [facility] Cardiologist: Sheryle Donning, MD   Subjective   Breathing is better, blood pressure improved. Headache improved as well. She does have sharp central chest pain with coughing. Cough has been dry/nonproductive.  We had extensive discussion re: medications and breastfeeding. She is both breastfeeding and using formula, and I told her we want to be able to breastfeed. I spoke with pharmacy team yesterday as well. In summary, there is little data on ARB and entresto excretion into breastmilk, but the limited studies did not show harm. Her EF is normal, which is reassuring, but she has significant risk factors given the postpartum pre-eclampsia. Discussed that I'd prefer to use a medication like an ARB for the vascular protection, but as above, there is limited safety data. Also discussed medications like nifedipine , which have more safety data, but don't have the data for CV benefit. After shared decision making, she would like to continue ARB, will take this in the AM and then breastfeed in the PM to minimize excretion. Discussed that at any time we can change to the calcium  channel blocker.  Inpatient Medications    Scheduled Meds:  enoxaparin  (LOVENOX ) injection  40 mg Subcutaneous Q24H   furosemide   40 mg Oral BID   levETIRAcetam   500 mg Oral BID   losartan   25 mg Oral Daily   potassium chloride   40 mEq Oral BID   sodium chloride  flush  3 mL Intravenous Q12H   Continuous Infusions:   PRN Meds: guaiFENesin-dextromethorphan, labetalol  **AND** labetalol  **AND** labetalol  **AND** hydrALAZINE  **AND** Measure blood pressure, ibuprofen , oxyCODONE , sodium chloride  flush   Vital Signs    Vitals:   07/19/23 2322 07/20/23 0422 07/20/23 0739 07/20/23 0918  BP: 127/68 137/78 131/86 130/81  Pulse: 83 75 71 84  Resp: 20 18 18    Temp: 98 F (36.7 C) 98.2 F (36.8 C) 98.3 F (36.8  C)   TempSrc: Oral Oral Oral   SpO2: 94% 96% 97% 98%  Weight:  78 kg      Intake/Output Summary (Last 24 hours) at 07/20/2023 1219 Last data filed at 07/20/2023 1610 Gross per 24 hour  Intake 2043 ml  Output 3200 ml  Net -1157 ml      07/20/2023    4:22 AM 07/19/2023    6:08 AM 07/18/2023   10:40 PM  Last 3 Weights  Weight (lbs) 172 lb 174 lb 12.8 oz 180 lb 12.8 oz  Weight (kg) 78.019 kg 79.289 kg 82.01 kg      Telemetry    SR - Personally Reviewed  Physical Exam   GEN: No acute distress.  On O2 by [place] Neck: No JVD Cardiac: RRR, no murmurs, rubs, or gallops.  Respiratory: largely clear, mildly diminished at bases GI: Soft, nontender, non-distended  MS: No edema; No deformity. 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 No history of pre-eclampsia with this or prior pregnancies before presentation, but was very hypertensive on presentation, consistent with post partum pre-eclampsia. Had headache, elevated LFTs on presentation. On keppra  for seizure prophylaxis.  -delivered on 07/11/23 at [redacted]w[redacted]d for poorly controlled gestational diabetes -reports several days of worsening orthopnea, LE edema, cough, and shortness of breath  post delivery -CTPE negative -imaging with pulmonary edema, cardiomegaly. Elevated BNP -echo personally reviewed. EF is preserved, RV function/size is normal, IVC normal with RAP 3 mmHg. Her diastolic parameters are normal, though she has been significantly diuresed. Together, this points to acute diastolic heart failure/pulmonary edema in the setting of pre-eclampsia (rather than typical diastolic heart failure due to underlying diastolic dysfunction) -Has responded well to oral diuresis. Will drop from BID to daily lasix   dosing -started on lisinopril , changed to ARB given concern for heart failure (and plans for possible entresto). However, this was in the setting of bottle feeding, but she is now trying to pump. Discussed with pharmacy, limited data on ARBs and entresto, though there is a study on entresto that notes minimal concentrations found in breast milk. Discussed with the patient the lack of data with breastfeeding on these meds vs other meds (such as calcium  channel blockers) with more data in breastfeeding but no CV benefits. After shared decision making, she would like to continue ARB, plans to take this in the AM, use formula then, and then will breastfeed/pump later in the day -would typically consider SLGT2i given findings above, history of gestational diabetes, though if she is planning to breastfeed there is no data on these medications in breastmilk -no beta blocker given normal EF -admission weight 82 kg, current weight 78 kg. Charted net negative 5.4 L  Overall she is much improved and stable from cardiac perspective. Ok for discharge from our end. Would take losartan  25 mg daily and furosemide  40 mg daily both in the AM. At follow up, may be able to change the furosemide  to as needed. Will arrange for close cardiology follow up either with me or OB clinic based on availability.  Recommendations discussed with Dr. Racheal Buddle.    Signed, Sheryle Donning, MD  07/20/2023, 12:19 PM

## 2023-07-20 NOTE — Progress Notes (Signed)
 Gynecology Progress Note  Admission Date: 07/18/2023 Current Date: 07/20/2023 12:25 PM  [name] is a 38 y.o. [id number] HD#3 admitted for postpartum preeclampsia.  Postpartum cardiomyopathy has been ruled out so far.     History complicated by: Patient Active Problem List   Diagnosis Date Noted   SOB (shortness of breath) 07/19/2023   Other heart failure (HCC) 07/19/2023   Pulmonary edema w/congestive heart failure w/preserved LV function (HCC) 07/19/2023   Pre-eclampsia in postpartum period 07/18/2023   Vacuum-assisted vaginal delivery 07/11/2023   Gestational diabetes mellitus (GDM) 07/09/2023   GBS bacteriuria 05/18/2023   Gestational diabetes 05/08/2023   Marginal insertion of umbilical cord affecting management of mother 03/10/2023   AMA (advanced maternal age) multigravida 35+ 02/10/2023   Maternal obesity affecting pregnancy, antepartum 02/10/2023   Supervision of high risk pregnancy, antepartum 01/07/2023    ROS and patient/family/surgical history, located on admission H&P note dated 07/18/2023, have been reviewed, and there are no changes except as noted below Yesterday/Overnight Events:  None significant  Subjective:  Pt is tolerating breathing on room air, but still has persistent cough.  She denies headache, visual changes and RUQ pain.  She is asking to go home.  Objective:   Vitals:   07/19/23 2322 07/20/23 0422 07/20/23 0739 07/20/23 0918  BP: 127/68 137/78 131/86 130/81  Pulse: 83 75 71 84  Resp: 20 18 18    Temp: 98 F (36.7 C) 98.2 F (36.8 C) 98.3 F (36.8 C)   TempSrc: Oral Oral Oral   SpO2: 94% 96% 97% 98%  Weight:  78 kg      Temp:  [97.7 F (36.5 C)-98.4 F (36.9 C)] 98.3 F (36.8 C) (05/20 0739) Pulse Rate:  [71-85] 84 (05/20 0918) Resp:  [18-30] 18 (05/20 0739) BP: (118-146)/(68-86) 130/81 (05/20 0918) SpO2:  [94 %-100 %] 98 % (05/20 0918) Weight:  [78 kg] 78 kg (05/20 0422) I/O last 3 completed shifts: In: 3096 [P.O.:3096] Out:  8275 [Urine:8275] Total I/O In: 3 [I.V.:3] Out: 250 [Urine:250]  Intake/Output Summary (Last 24 hours) at 07/20/2023 1225 Last data filed at 07/20/2023 1478 Gross per 24 hour  Intake 2043 ml  Output 3200 ml  Net -1157 ml     Current Vital Signs 24h Vital Sign Ranges  T 98.3 F (36.8 C) Temp  Avg: 98.1 F (36.7 C)  Min: 97.7 F (36.5 C)  Max: 98.4 F (36.9 C)  BP 130/81 BP  Min: 118/72  Max: 146/83  HR 84 Pulse  Avg: 79.1  Min: 71  Max: 85  RR 18 Resp  Avg: 22.4  Min: 18  Max: 30  SaO2 98 % Room Air SpO2  Avg: 96.6 %  Min: 94 %  Max: 100 %       24 Hour I/O Current Shift I/O  Time Ins Outs 05/19 0701 - 05/20 0700 In: 2640 [P.O.:2640] Out: 3450 [Urine:3450] 05/20 0701 - 05/20 1900 In: 3 [I.V.:3] Out: 250 [Urine:250]   Patient Vitals for the past 12 hrs:  BP Temp Temp src Pulse Resp SpO2 Weight  07/20/23 0918 130/81 -- -- 84 -- 98 % --  07/20/23 0739 131/86 98.3 F (36.8 C) Oral 71 18 97 % --  07/20/23 0422 137/78 98.2 F (36.8 C) Oral 75 18 96 % 78 kg     Patient Vitals for the past 24 hrs:  BP Temp Temp src Pulse Resp SpO2 Weight  07/20/23 0918 130/81 -- -- 84 -- 98 % --  07/20/23 0739 131/86 98.3  F (36.8 C) Oral 71 18 97 % --  07/20/23 0422 137/78 98.2 F (36.8 C) Oral 75 18 96 % 78 kg  07/19/23 2322 127/68 98 F (36.7 C) Oral 83 20 94 % --  07/19/23 2000 (!) 146/83 97.7 F (36.5 C) Oral 73 (!) 22 99 % --  07/19/23 1818 -- -- -- -- (!) 22 -- --  07/19/23 1629 134/83 98.4 F (36.9 C) Oral 83 19 100 % --  07/19/23 1405 -- -- -- -- (!) 30 95 % --  07/19/23 1237 118/72 97.9 F (36.6 C) Oral 85 (!) 30 94 % --    Physical exam: General appearance: alert, cooperative, appears stated age, and no distress Abdomen: soft, non-tender; bowel sounds normal; no masses,  no organomegaly GU: No gross VB Lungs: clear to auscultation bilaterally Heart: regular rate and rhythm Extremities: no lower extremity edema Skin: WNL Psych: appropriate Neurologic: Grossly  normal  Medications Current Facility-Administered Medications  Medication Dose Route Frequency Provider Last Rate Last Admin   enoxaparin  (LOVENOX ) injection 40 mg  40 mg Subcutaneous Q24H Lacey Pian, MD   40 mg at 07/19/23 2035   [START ON 07/21/2023] furosemide  (LASIX ) tablet 40 mg  40 mg Oral Daily Sheryle Donning, MD       guaiFENesin-dextromethorphan (ROBITUSSIN DM) 100-10 MG/5ML syrup 5 mL  5 mL Oral Q4H PRN Abigail Abler, MD       labetalol  (NORMODYNE ) injection 20 mg  20 mg Intravenous PRN Lacey Pian, MD       And   labetalol  (NORMODYNE ) injection 40 mg  40 mg Intravenous PRN Lacey Pian, MD       And   labetalol  (NORMODYNE ) injection 80 mg  80 mg Intravenous PRN Lacey Pian, MD       And   hydrALAZINE  (APRESOLINE ) injection 10 mg  10 mg Intravenous PRN Lacey Pian, MD       ibuprofen  (ADVIL ) tablet 600 mg  600 mg Oral Q6H PRN Lacey Pian, MD   600 mg at 07/19/23 0515   levETIRAcetam  (KEPPRA ) tablet 500 mg  500 mg Oral BID Lacey Pian, MD   500 mg at 07/20/23 5621   losartan  (COZAAR ) tablet 25 mg  25 mg Oral Daily Sheryle Donning, MD   25 mg at 07/20/23 3086   oxyCODONE  (Oxy IR/ROXICODONE ) immediate release tablet 5 mg  5 mg Oral Q4H PRN Marci Setter, MD   5 mg at 07/19/23 1013   potassium chloride  (KLOR-CON ) packet 40 mEq  40 mEq Oral BID Tresia Fruit, MD   40 mEq at 07/20/23 0915   sodium chloride  flush (NS) 0.9 % injection 3 mL  3 mL Intravenous Q12H Lacey Pian, MD   3 mL at 07/20/23 5784   sodium chloride  flush (NS) 0.9 % injection 3 mL  3 mL Intravenous PRN Lacey Pian, MD          Labs  Recent Labs  Lab 07/18/23 1715  WBC 8.0  HGB 10.2*  HCT 32.5*  PLT 240    Recent Labs  Lab 07/19/23 0436 07/19/23 1655 07/20/23 0611  NA 143 141 142  K 3.1* 3.2* 3.5  CL 111 109 110  CO2 22 22 23   BUN 9 8 7   CREATININE 0.58 0.69 0.61  CALCIUM  8.3* 8.0* 8.0*  PROT 5.9* 6.0* 5.7*  BILITOT 0.6 0.5 0.3  ALKPHOS 85 78 68  ALT 222*  196* 142*  AST 152* 92* 42*  GLUCOSE 78 81 91  Radiology N/a  Assessment & Plan:  Postpartum preeclampsia Initial elevation of LFTs noted with gradual decline. Patient otherwise asymptomatic.  BP improved on losartan . Appreciate cardiology assistance. Robitussin/guaifenesin  given to address cough Recheck CMP in AM Anticipate d/c in AM  Code Status: Full Code  Avie Boeck, MD  Attending Center for [facility])

## 2023-07-21 ENCOUNTER — Other Ambulatory Visit (HOSPITAL_COMMUNITY): Payer: Self-pay

## 2023-07-21 LAB — COMPREHENSIVE METABOLIC PANEL WITH GFR
ALT: 105 U/L — ABNORMAL HIGH (ref 0–44)
AST: 29 U/L (ref 15–41)
Albumin: 2.8 g/dL — ABNORMAL LOW (ref 3.5–5.0)
Alkaline Phosphatase: 74 U/L (ref 38–126)
Anion gap: 7 (ref 5–15)
BUN: 9 mg/dL (ref 6–20)
CO2: 22 mmol/L (ref 22–32)
Calcium: 8.1 mg/dL — ABNORMAL LOW (ref 8.9–10.3)
Chloride: 111 mmol/L (ref 98–111)
Creatinine, Ser: 0.66 mg/dL (ref 0.44–1.00)
GFR, Estimated: >60 mL/min (ref >60)
Glucose, Bld: 95 mg/dL (ref 70–99)
Potassium: 4 mmol/L (ref 3.5–5.1)
Sodium: 140 mmol/L (ref 135–145)
Total Bilirubin: 0.2 mg/dL (ref 0.0–1.2)
Total Protein: 5.9 g/dL — ABNORMAL LOW (ref 6.5–8.1)

## 2023-07-21 MED ORDER — FUROSEMIDE 40 MG PO TABS
40.0000 mg | ORAL_TABLET | Freq: Every day | ORAL | 0 refills | Status: DC
Start: 2023-07-22 — End: 2023-09-06
  Filled 2023-07-21: qty 3, 3d supply, fill #0

## 2023-07-21 MED ORDER — GUAIFENESIN-DM 100-10 MG/5ML PO SYRP
5.0000 mL | ORAL_SOLUTION | ORAL | 0 refills | Status: AC | PRN
  Filled 2023-07-21: qty 90, 3d supply, fill #0

## 2023-07-21 MED ORDER — IBUPROFEN 600 MG PO TABS
600.0000 mg | ORAL_TABLET | Freq: Four times a day (QID) | ORAL | 0 refills | Status: DC | PRN
  Filled 2023-07-21: qty 30, 8d supply, fill #0

## 2023-07-21 MED ORDER — POTASSIUM CHLORIDE 20 MEQ PO PACK
40.0000 meq | PACK | Freq: Every day | ORAL | 0 refills | Status: DC
  Filled 2023-07-21: qty 6, 3d supply, fill #0

## 2023-07-21 MED ORDER — LOSARTAN POTASSIUM 25 MG PO TABS
25.0000 mg | ORAL_TABLET | Freq: Every day | ORAL | 0 refills | Status: DC
  Filled 2023-07-21: qty 30, 30d supply, fill #0

## 2023-07-21 NOTE — Progress Notes (Signed)
   07/21/23 1250  Departure Condition  Departure Condition Good  Mobility at [facility]  Patient/Caregiver Teaching Teach Back Method Used;Discharge instructions reviewed;Prescriptions reviewed;Pain management discussed;Educated about hypertension in pregnancy;Patient/caregiver verbalized understanding;Admission discussed;Follow-up care reviewed;Medications discussed  Departure Mode With family  Was procedural sedation performed on this patient during this visit? No   Patient alert and oriented x4, VS and pain stable.

## 2023-07-21 NOTE — Patient Instructions (Signed)
 Call [facility] in [place]  at [phone] to discuss any medications concerns while breastfeeding.  If interested in an outpatient lactation consult in office or virtually please reach out to us  at [facility] (First Floor) [address], [place] Please call [phone] and press [phone] for lactation.    Alyce Jubilee, [facility] for [facility]

## 2023-07-21 NOTE — Discharge Summary (Signed)
 Physician Discharge Summary  Patient ID: April Boone MRN: [id number] DOB/AGE: 38-Dec-1987 38 y.o.  Admit date: 07/18/2023 Discharge date: 07/21/2023  Admission Diagnoses:  postpartum preeclampsia, shortness of breath  Discharge Diagnoses:  Principal Problem:   Pre-eclampsia in postpartum period Active Problems:   SOB (shortness of breath)   Other heart failure (HCC)   Pulmonary edema w/congestive heart failure w/preserved LV function (HCC)   Discharged Condition: good  Hospital Course: Pt returned to MAU after VAVD with shortness of breath and chest pain.  Initial blood pressures were elevated and patient was diagnosed with postpartum preeclampsia.  Pt was evaluated by cardiology and she received an echocardiogram which was normal. Pt was diuresed with lasix  and symptoms began to improve. Pt received keppra  instead of magnesium  sulfate due to concern regarding cardiomyopathy.  Pt did well and blood pressure was well controlled with losartan .  Pt was discharged with improvement of cough and shortness of breath.  Consults: cardiology  Significant Diagnostic Studies: cardiac graphics: Echocardiogram: normal chamber and size and ejection fraction  Treatments: keppra , antihypertensives with losartan   Discharge Exam: Blood pressure 121/78, pulse 84, temperature 98.2 F (36.8 C), temperature source Oral, resp. rate 18, weight 80.1 kg, last menstrual period 10/11/2022, SpO2 99%, unknown if currently breastfeeding. General appearance: alert, cooperative, and no distress Head: Normocephalic, without obvious abnormality, atraumatic Resp: clear to auscultation bilaterally Cardio: regular rate and rhythm Extremities: Homans sign is negative, no sign of DVT  Disposition: Discharge disposition: 01-Home or Self Care       Discharge Instructions     Activity as tolerated   Complete by: As directed    Call MD for:  difficulty breathing, headache or visual disturbances   Complete by:  As directed    Call MD for:  persistant dizziness or light-headedness   Complete by: As directed    Call MD for:  persistant nausea and vomiting   Complete by: As directed    Call MD for:  severe uncontrolled pain   Complete by: As directed    Call MD for:  temperature >100.4   Complete by: As directed    Diet general   Complete by: As directed    Sexual activity   Complete by: As directed    Pelvic rest 3-4 weeks      Allergies as of 07/21/2023   No Known Allergies      Medication List     STOP taking these medications    benzocaine -Menthol  20-0.5 % Aero Commonly known as: DERMOPLAST   dibucaine 1 % Oint Commonly known as: NUPERCAINAL   witch hazel-glycerin  pad Commonly known as: TUCKS       TAKE these medications    acetaminophen  500 MG tablet Commonly known as: TYLENOL  Take 2 tablets (1,000 mg total) by mouth every 6 (six) hours as needed for headache.   furosemide  40 MG tablet Commonly known as: LASIX  Take 1 tablet (40 mg total) by mouth daily. Start taking on: Jul 22, 2023   guaiFENesin-dextromethorphan 100-10 MG/5ML syrup Commonly known as: ROBITUSSIN DM Take 5 mLs by mouth every 4 (four) hours as needed for up to 3 days for cough.   ibuprofen  600 MG tablet Commonly known as: ADVIL  Take 1 tablet (600 mg total) by mouth every 6 (six) hours as needed for fever, headache, mild pain (pain score 1-3) or cramping. What changed:  when to take this reasons to take this   losartan  25 MG tablet Commonly known as: COZAAR  Take 1 tablet (25 mg  total) by mouth daily. Start taking on: Jul 22, 2023   potassium chloride  20 MEQ packet Commonly known as: KLOR-CON  Take 40 mEq by mouth daily.   Prenatal Vitamins 28-0.8 MG Tabs Take 1 tablet by mouth daily.   Vitamin D  (Cholecalciferol ) 25 MCG (1000 UT) Caps Take 1 tablet by mouth daily.        Follow-up Information     Clearnce Curia, NP. Go on 08/02/2023.   Specialty: Cardiology Why: Please see  Neomi Banks (NP in cardiology at Dr. Idolina Maker office) 08/02/23 at 8:25 AM Contact information: [address] [place] [address] [phone]         [facility] for [facility] at [facility]. Schedule an appointment as soon as possible for a visit in 1 week(s).   Specialty: Obstetrics and Gynecology Why: BP check Contact information: [address] [place]  [address] [phone]                Signed: Abigail Abler 07/21/2023, 11:20 AM

## 2023-07-21 NOTE — Consult Note (Signed)
   Outpatient Lactation Note  Visited parent per MD request to review comparability of losartan  and breast.Mom stated she did not have any questions or concerns at the moment. Informed mom  we would provider her with [facility] number if she had any concerns about medication.   April Boone, [facility] for [facility]

## 2023-07-22 ENCOUNTER — Telehealth: Payer: Self-pay

## 2023-07-22 NOTE — Telephone Encounter (Signed)
 Incoming VM from [place], [employer]. She is calling to report patient BP 144/98. Pt is not having any symptoms. She was discharged from the hospital on yesterday. Pt had not yet taken her meds that she was discharged with, but [place] witnessed patient take them in the home at the time of visit. Christobal Craft is requesting for patient to have a 1 week follow up visit.

## 2023-07-27 ENCOUNTER — Telehealth (HOSPITAL_COMMUNITY): Payer: Self-pay | Admitting: *Deleted

## 2023-07-27 NOTE — Telephone Encounter (Signed)
 07/27/2023  Name: Yanet Lottes MRN: [id number] DOB: [birth year]  Reason for Call:  Transition of Care Hospital Discharge Call  Contact Status: Patient Contact Status: Complete  Language assistant needed: Interpreter Mode: Telephonic Interpreter Interpreter Name: Autry Legions [id number]        Follow-Up Questions: Do You Have Any Concerns About Your Health As You Heal From Delivery?: No Do You Have Any Concerns About Your Infants Health?: No  Edinburgh Postnatal Depression Scale:  In the Past 7 Days: I have been able to laugh and see the funny side of things.: As much as I always could I have looked forward with enjoyment to things.: As much as I ever did I have blamed myself unnecessarily when things went wrong.: No, never I have been anxious or worried for no good reason.: No, not at all I have felt scared or panicky for no good reason.: No, not at all Things have been getting on top of me.: No, I have been coping as well as ever I have been so unhappy that I have had difficulty sleeping.: Not at all I have felt sad or miserable.: No, not at all I have been so unhappy that I have been crying.: No, never The thought of harming myself has occurred to me.: Never Edinburgh Postnatal Depression Scale Total: 0  PHQ2-9 Depression Scale:     Discharge Follow-up: Edinburgh score requires follow up?: No  Post-discharge interventions: Reviewed Newborn Safe Sleep Practices  Pearlie Bougie, RN 07/27/2023 12:12

## 2023-07-28 ENCOUNTER — Ambulatory Visit

## 2023-07-29 ENCOUNTER — Ambulatory Visit

## 2023-07-29 DIAGNOSIS — Z0130 Encounter for examination of blood pressure without abnormal findings: Secondary | ICD-10-CM

## 2023-07-29 NOTE — Progress Notes (Signed)
 Subjective:  April Boone is a 38 y.o. female here for BP check.   Hypertension ROS: taking medications as instructed, no medication side effects noted, no TIA's, no chest pain on exertion, no dyspnea on exertion, and no swelling of ankles.    Objective:  BP 126/80   Pulse 79   LMP 10/11/2022 (Exact Date)   Appearance alert, well appearing, and in no distress. General exam BP noted to be well controlled today in office.    Assessment:   Blood Pressure well controlled.   Plan:  Current treatment plan is effective, no change in therapy.Aaron Aas

## 2023-08-02 ENCOUNTER — Ambulatory Visit (HOSPITAL_BASED_OUTPATIENT_CLINIC_OR_DEPARTMENT_OTHER): Admitting: Family

## 2023-08-05 ENCOUNTER — Ambulatory Visit: Admitting: Physician Assistant

## 2023-08-06 ENCOUNTER — Encounter (HOSPITAL_BASED_OUTPATIENT_CLINIC_OR_DEPARTMENT_OTHER): Payer: Self-pay

## 2023-08-09 ENCOUNTER — Other Ambulatory Visit

## 2023-08-09 ENCOUNTER — Encounter (HOSPITAL_BASED_OUTPATIENT_CLINIC_OR_DEPARTMENT_OTHER): Payer: Self-pay

## 2023-08-09 ENCOUNTER — Ambulatory Visit (HOSPITAL_BASED_OUTPATIENT_CLINIC_OR_DEPARTMENT_OTHER): Admitting: Family

## 2023-08-10 ENCOUNTER — Other Ambulatory Visit

## 2023-08-10 ENCOUNTER — Ambulatory Visit: Admitting: Obstetrics and Gynecology

## 2023-08-10 ENCOUNTER — Telehealth: Payer: Self-pay

## 2023-08-10 DIAGNOSIS — O0990 Supervision of high risk pregnancy, unspecified, unspecified trimester: Secondary | ICD-10-CM

## 2023-08-10 DIAGNOSIS — Z758 Other problems related to medical facilities and other health care: Secondary | ICD-10-CM

## 2023-08-10 DIAGNOSIS — Z8679 Personal history of other diseases of the circulatory system: Secondary | ICD-10-CM | POA: Diagnosis not present

## 2023-08-10 DIAGNOSIS — O24419 Gestational diabetes mellitus in pregnancy, unspecified control: Secondary | ICD-10-CM

## 2023-08-10 DIAGNOSIS — Z8759 Personal history of other complications of pregnancy, childbirth and the puerperium: Secondary | ICD-10-CM | POA: Diagnosis not present

## 2023-08-10 LAB — POCT URINALYSIS DIPSTICK
Bilirubin, UA: NEGATIVE
Blood, UA: NEGATIVE
Glucose, UA: NEGATIVE
Ketones, UA: NEGATIVE
Nitrite, UA: NEGATIVE
Protein, UA: NEGATIVE
Spec Grav, UA: 1.015 (ref 1.010–1.025)
Urobilinogen, UA: 0.2 U/dL
pH, UA: 6 (ref 5.0–8.0)

## 2023-08-10 NOTE — Telephone Encounter (Signed)
 TC to pt per Susi Eric, FNP recs to continue checking BP until cardiology f/u on 7/6. No answer, left pt VM with instructions to continue checking BP until her cardiology f/u.

## 2023-08-10 NOTE — Progress Notes (Signed)
 Post Partum Visit Note  April Boone is a 38 y.o. [id number] female who presents for a postpartum visit. She is 4 weeks postpartum following a vacuum-assisted vaginal delivery.  I have fully reviewed the prenatal and intrapartum course. The delivery was at 37 gestational weeks.  Anesthesia: epidural. Postpartum course has been good. Baby is doing well yes. Baby is feeding by breast. Bleeding staining only. Bowel function is normal. Bladder function is normal. Patient is not sexually active. Contraception method is none. Postpartum depression screening: negative.   The pregnancy intention screening data noted above was reviewed. Potential methods of contraception were discussed. The patient elected to proceed with No data recorded.   Edinburgh Postnatal Depression Scale - 08/10/23 0924       Edinburgh Postnatal Depression Scale:  In the Past 7 Days   I have been able to laugh and see the funny side of things. 0    I have looked forward with enjoyment to things. 0    I have blamed myself unnecessarily when things went wrong. 0    I have been anxious or worried for no good reason. 0    I have felt scared or panicky for no good reason. 0    Things have been getting on top of me. 0    I have been so unhappy that I have had difficulty sleeping. 0    I have felt sad or miserable. 0    I have been so unhappy that I have been crying. 0    The thought of harming myself has occurred to me. 0    Edinburgh Postnatal Depression Scale Total 0             Health Maintenance Due  Topic Date Due   Pneumococcal Vaccine 15-73 Years old (1 of 2 - PCV) Never done    The following portions of the patient's history were reviewed and updated as appropriate: allergies, current medications, past family history, past medical history, past social history, past surgical history, and problem list.  Review of Systems Pertinent items are noted in HPI.  Objective:  BP 117/81   Pulse 95   Ht 5\' 6"   (1.676 m)   Wt 187 lb 1.6 oz (84.9 kg)   LMP 10/11/2022 (Exact Date)   Breastfeeding Yes   BMI 30.20 kg/m    General:  alert and cooperative   Breasts:  not indicated  Lungs: Normal effort     Abdomen: Non distended    Wound N/a  GU exam:  not indicated       Assessment:   1. History of postpartum pre-eclampsia Readmitted pp for pre-eclampsia, placed on losartan . Has cardiology appt 7/7  2. Gestational diabetes mellitus (GDM), antepartum, gestational diabetes method of control unspecified Will reschedule 2hr GTT next week   3. Does not have primary care provider  - Ambulatory referral to [facility]  4. Postpartum exam (Primary)  - POCT Urinalysis Dipstick - Urine Culture   Plan:   Essential components of care per ACOG recommendations:  1.  Mood and well being: Patient with negative depression screening today. Reviewed local resources for support.  - Patient tobacco use? No.   - hx of drug use? No.    2. Infant care and feeding:  -Patient currently breastmilk feeding? Yes. Discussed returning to work and pumping.  -Social determinants of health (SDOH) reviewed in EPIC. No concerns  3. Sexuality, contraception and birth spacing - Patient does not want a pregnancy in  the next year.  Desired family size is  children.  - Reviewed reproductive life planning. Reviewed contraceptive methods based on pt preferences and effectiveness.  Patient desired No Method - Other Reason today.   - Discussed birth spacing of 18 months  4. Sleep and fatigue -Encouraged family/partner/community support of 4 hrs of uninterrupted sleep to help with mood and fatigue  5. Physical Recovery  - Discussed patients delivery and complications. She describes her labor as good. - Patient had a vaginal delivery. Patient had a none laceration. Perineal healing reviewed. Patient expressed understanding - Patient has urinary incontinence? No. - Patient is safe to resume physical and sexual  activity   6.  Health Maintenance - HM due items addressed Yes - Last pap smear  Diagnosis  Date Value Ref Range Status  02/10/2023   Final   - Negative for intraepithelial lesion or malignancy (NILM)   Pap smear not done at today's visit.  -Breast Cancer screening indicated? No.   7. Chronic Disease/Pregnancy Condition follow up: Hypertension  - PCP follow up Future Appointments  Date Time Provider Department Center  08/17/2023  8:00 AM [facility] None  09/06/2023  2:20 PM Clearnce Curia, NP [facility]     Susi Eric, FNP  Center for [employer], [facility]

## 2023-08-12 LAB — URINE CULTURE

## 2023-08-13 ENCOUNTER — Ambulatory Visit: Payer: Self-pay | Admitting: Obstetrics and Gynecology

## 2023-08-13 MED ORDER — AMOXICILLIN-POT CLAVULANATE 500-125 MG PO TABS: 1.0000 | ORAL_TABLET | Freq: Two times a day (BID) | ORAL | 0 refills | Status: AC

## 2023-08-17 ENCOUNTER — Ambulatory Visit

## 2023-08-17 ENCOUNTER — Other Ambulatory Visit

## 2023-08-17 VITALS — BP 116/80 | HR 88 | Wt 189.8 lb

## 2023-08-17 DIAGNOSIS — Z8679 Personal history of other diseases of the circulatory system: Secondary | ICD-10-CM

## 2023-08-17 DIAGNOSIS — Z8632 Personal history of gestational diabetes: Secondary | ICD-10-CM

## 2023-08-17 DIAGNOSIS — Z0130 Encounter for examination of blood pressure without abnormal findings: Secondary | ICD-10-CM

## 2023-08-17 NOTE — Progress Notes (Signed)
 Subjective:  April Boone is a 38 y.o. female here for BP check.   Hypertension ROS: Patient stopped taking meds two days ago.    Objective:  BP 116/80 (BP Location: Right Arm, Patient Position: Sitting, Cuff Size: Large)   Pulse 88   Wt 189 lb 12.8 oz (86.1 kg)   Breastfeeding Yes   BMI 30.63 kg/m   Appearance alert, well appearing, and in no distress. General exam BP noted to be well controlled today in office.    Assessment:   Blood Pressure well controlled.   Plan:  Follow up as needed.   Sevana Grandinetti l Kaelie Henigan, CMA

## 2023-08-18 LAB — GLUCOSE TOLERANCE, 2 HOURS
Glucose, 2 hour: 73 mg/dL (ref 70–139)
Glucose, GTT - Fasting: 93 mg/dL (ref 70–99)

## 2023-08-31 ENCOUNTER — Ambulatory Visit: Payer: Self-pay | Admitting: Obstetrics & Gynecology

## 2023-09-02 ENCOUNTER — Encounter (HOSPITAL_BASED_OUTPATIENT_CLINIC_OR_DEPARTMENT_OTHER): Payer: Self-pay

## 2023-09-06 ENCOUNTER — Ambulatory Visit (HOSPITAL_BASED_OUTPATIENT_CLINIC_OR_DEPARTMENT_OTHER): Admitting: Family

## 2023-09-06 ENCOUNTER — Encounter (HOSPITAL_BASED_OUTPATIENT_CLINIC_OR_DEPARTMENT_OTHER): Payer: Self-pay | Admitting: Family

## 2023-09-06 VITALS — BP 123/81 | HR 99 | Ht 62.0 in | Wt 197.0 lb

## 2023-09-06 DIAGNOSIS — Z8759 Personal history of other complications of pregnancy, childbirth and the puerperium: Secondary | ICD-10-CM | POA: Diagnosis not present

## 2023-09-06 DIAGNOSIS — M25532 Pain in left wrist: Secondary | ICD-10-CM | POA: Diagnosis not present

## 2023-09-06 MED ORDER — IBUPROFEN 200 MG PO CAPS: 400.0000 mg | ORAL_CAPSULE | Freq: Two times a day (BID) | ORAL | 0 refills | Status: AC

## 2023-09-06 NOTE — Patient Instructions (Addendum)
 Medication Instructions:   START Iburpofen 400mg  (two 200mg  tablets) twice per day with food for 5 days to help with wrist pain  Because your blood pressure looks good today, no need for blood pressure medicine.   *If you need a refill on your cardiac medications before your next appointment, please call your pharmacy*  Follow-Up: At [facility], you and your health needs are our priority.  As part of our continuing mission to provide you with exceptional heart care, our providers are all part of one team.  This team includes your primary Cardiologist (physician) and Advanced Practice Providers or APPs (Physician Assistants and Nurse Practitioners) who all work together to provide you with the care you need, when you need it.  Your next appointment:   As needed with cardiology   We recommend signing up for the patient portal called MyChart.  Sign up information is provided on this After Visit Summary.  MyChart is used to connect with patients for Virtual Visits (Telemedicine).  Patients are able to view lab/test results, encounter notes, upcoming appointments, etc.  Non-urgent messages can be sent to your provider as well.   To learn more about what you can do with MyChart, go to [email or web address].   Other Instructions  Recommend ice pack for 20 minutes on and 20 minutes off to your left wrist If wrist pain does not improve with ice and ibuprofen , recommend talking with primary care  If you had high blood pressure with one pregnancy (called preeclampsia) it is more likely to have with future pregnancies. Your OBGYN will monitor carefully with any future pregnancies and consult us  if needed.   If you need primary care, Dr. Catheryn Slocumb is great. Her office is located at: [facility] [address] [place] [address] [place], [place] [address] [phone]  ____  Can also have you and children see: [facility] [address] [place],  [place]  [address] Main: [phone]  or call  Velinda ODESSIA Alcon [facility] [phone]  _____     To prevent palpitations: Make sure you are adequately hydrated.  Avoid and/or limit caffeine containing beverages like soda or tea. Exercise regularly.  Manage stress well. Some over the counter medications can cause palpitations such as Benadryl , AdvilPM, TylenolPM. Regular Advil  or Tylenol  do not cause palpitations.

## 2023-09-06 NOTE — Progress Notes (Signed)
 Cardiology Office Note   Date:  09/06/2023  ID:  April Boone, DOB 03-Feb-1986, MRN [id number] PCP: Pcp, No  [facility] Providers Cardiologist:  Shelda Bruckner, MD     History of Present Illness April Boone is a 38 y.o. female with hx of preeclampsia.   Admitted 5/18-5/21/25 for postpartum preeclampsia. Echocardiogram unremarkable. Symptoms improved with diuresed. Discharged on losartan , lasix , potassium.   Presents today for follow up with interpretor. Feeling well since discharge. She and her chlidren are all doing well. She has not ben taking lasix  nor losartan . Does not have BP cuff at home for monitoring. She notes occasional palpitations wit hwalking and plans to gradually increase her activity. No chest pain, exertional dyspnea. Her chief concern today is pain in her left wrist, no known injury.   ROS: Please see the history of present illness.    All other systems reviewed and are negative.   Studies Reviewed      Cardiac Studies & Procedures   ______________________________________________________________________________________________     ECHOCARDIOGRAM  ECHOCARDIOGRAM COMPLETE 07/19/2023  Narrative ECHOCARDIOGRAM REPORT    Patient Name:   April Boone Date of Exam: 07/19/2023 Medical Rec #:  [id number]          Height:       66.0 in Accession #:    [id number]         Weight:       174.8 lb Date of Birth:  04-Sep-1985           BSA:          1.889 m Patient Age:    37 years           BP:           136/81 mmHg Patient Gender: F                  HR:           76 bpm. Exam Location:  Inpatient  Procedure: 2D Echo, Cardiac Doppler and Color Doppler (Both Spectral and Color Flow Doppler were utilized during procedure).  Indications:    Other abnormalities of the heart R00.8 Cardiomegaly on CT, BNP >400, preclampsia in postpartum time period  History:        Patient has no prior history of Echocardiogram examinations.  Sonographer:     Tinnie Gosling RDCS Referring Phys: [id number] PAULA DUNCAN  IMPRESSIONS   1. Left ventricular ejection fraction, by estimation, is 60 to 65%. The left ventricle has normal function. The left ventricle has no regional wall motion abnormalities. Left ventricular diastolic parameters were normal. 2. Right ventricular systolic function is normal. The right ventricular size is normal. 3. The mitral valve is normal in structure. No evidence of mitral valve regurgitation. No evidence of mitral stenosis. 4. The aortic valve is normal in structure. Aortic valve regurgitation is not visualized. No aortic stenosis is present. 5. The inferior vena cava is normal in size with greater than 50% respiratory variability, suggesting right atrial pressure of 3 mmHg.  Comparison(s): No prior Echocardiogram.  FINDINGS Left Ventricle: Left ventricular ejection fraction, by estimation, is 60 to 65%. The left ventricle has normal function. The left ventricle has no regional wall motion abnormalities. The left ventricular internal cavity size was normal in size. There is no left ventricular hypertrophy. Left ventricular diastolic parameters were normal.  Right Ventricle: The right ventricular size is normal. Right ventricular systolic function is normal.  Left Atrium: Left atrial size was normal in size.  Right Atrium: Right atrial size was normal in size.  Pericardium: There is no evidence of pericardial effusion.  Mitral Valve: The mitral valve is normal in structure. No evidence of mitral valve regurgitation. No evidence of mitral valve stenosis.  Tricuspid Valve: The tricuspid valve is normal in structure. Tricuspid valve regurgitation is not demonstrated. No evidence of tricuspid stenosis.  Aortic Valve: The aortic valve is normal in structure. Aortic valve regurgitation is not visualized. No aortic stenosis is present.  Pulmonic Valve: The pulmonic valve was normal in structure. Pulmonic valve  regurgitation is not visualized. No evidence of pulmonic stenosis.  Aorta: The aortic root is normal in size and structure.  Venous: The inferior vena cava is normal in size with greater than 50% respiratory variability, suggesting right atrial pressure of 3 mmHg.  IAS/Shunts: No atrial level shunt detected by color flow Doppler.   LEFT VENTRICLE PLAX 2D LVIDd:         4.10 cm   Diastology LVIDs:         2.50 cm   LV e' medial:    9.25 cm/s LV PW:         1.10 cm   LV E/e' medial:  12.2 LV IVS:        1.10 cm   LV e' lateral:   9.79 cm/s LVOT diam:     2.00 cm   LV E/e' lateral: 11.5 LV SV:         65 LV SV Index:   35 LVOT Area:     3.14 cm   RIGHT VENTRICLE             IVC RV S prime:     12.30 cm/s  IVC diam: 1.80 cm TAPSE (M-mode): 1.8 cm  LEFT ATRIUM             Index        RIGHT ATRIUM           Index LA diam:        3.20 cm 1.69 cm/m   RA Area:     12.60 cm LA Vol (A2C):   39.0 ml 20.65 ml/m  RA Volume:   27.20 ml  14.40 ml/m LA Vol (A4C):   40.2 ml 21.29 ml/m LA Biplane Vol: 40.5 ml 21.44 ml/m AORTIC VALVE LVOT Vmax:   103.00 cm/s LVOT Vmean:  68.900 cm/s LVOT VTI:    0.208 m  AORTA Ao Root diam: 2.90 cm Ao Asc diam:  3.10 cm  MITRAL VALVE MV Area (PHT): 3.79 cm     SHUNTS MV Decel Time: 200 msec     Systemic VTI:  0.21 m MV E velocity: 113.00 cm/s  Systemic Diam: 2.00 cm MV A velocity: 77.00 cm/s MV E/A ratio:  1.47  Redell Shallow MD Electronically signed by Redell Shallow MD Signature Date/Time: 07/19/2023/9:21:09 AM    Final          ______________________________________________________________________________________________      Risk Assessment/Calculations           Physical Exam VS:  BP 123/81 (BP Location: Left Arm, Patient Position: Sitting, Cuff Size: Normal)   Pulse 99   Ht 5' 2 (1.575 m)   Wt 197 lb (89.4 kg)   SpO2 98%   Breastfeeding Yes   BMI 36.03 kg/m        Wt Readings from Last 3 Encounters:  09/06/23  197 lb (89.4 kg)  08/17/23 189 lb 12.8 oz (86.1 kg)  08/10/23 187  lb 1.6 oz (84.9 kg)    GEN: Well nourished, well developed in no acute distress NECK: No JVD; No carotid bruits CARDIAC: RRR, no murmurs, rubs, gallops RESPIRATORY:  Clear to auscultation without rales, wheezing or rhonchi  ABDOMEN: Soft, non-tender, non-distended EXTREMITIES:  No edema; No deformity   ASSESSMENT AND PLAN  Preeclampsia - BP controlled without antihypertensive medications. No need for antihypertensive therapy at this time. We discussed risk for preeclampsia with future pregnancies and need to establish with OBGYN promptly if she elects for future pregnancies. Recommend aiming for 150 minutes of moderate intensity activity per week and following a heart healthy diet.   L wrist pain - tender on palpation, no known injury. Encouraged short course of ibuprofen  and ice and to follow up with PCP if no improvement.        Dispo: follow up with cardiology PRN  Signed, Reche GORMAN Finder, NP

## 2023-10-15 ENCOUNTER — Emergency Department (HOSPITAL_BASED_OUTPATIENT_CLINIC_OR_DEPARTMENT_OTHER)

## 2023-10-15 ENCOUNTER — Emergency Department (HOSPITAL_BASED_OUTPATIENT_CLINIC_OR_DEPARTMENT_OTHER)
Admission: EM | Admit: 2023-10-15 | Discharge: 2023-10-15 | Disposition: A | Discharge status: Home / Self Care | Attending: Emergency Medicine | Admitting: Emergency Medicine

## 2023-10-15 ENCOUNTER — Encounter (HOSPITAL_BASED_OUTPATIENT_CLINIC_OR_DEPARTMENT_OTHER): Payer: Self-pay | Admitting: Emergency Medicine

## 2023-10-15 DIAGNOSIS — M25532 Pain in left wrist: Secondary | ICD-10-CM | POA: Diagnosis present

## 2023-10-15 DIAGNOSIS — M654 Radial styloid tenosynovitis [de Quervain]: Secondary | ICD-10-CM | POA: Diagnosis not present

## 2023-10-15 MED ORDER — PREDNISONE 20 MG PO TABS: ORAL_TABLET | ORAL | 0 refills | Status: AC

## 2023-10-15 MED ORDER — ACETAMINOPHEN 500 MG PO TABS
1000.0000 mg | ORAL_TABLET | Freq: Once | ORAL | Status: AC
  Administered 2023-10-15: 1000 mg via ORAL
  Filled 2023-10-15: qty 2

## 2023-10-15 NOTE — Discharge Instructions (Signed)
 1.  Wear the wrist splint and try to elevate and ice your wrist is much as possible.  You may take the prescription for prednisone  to help with swelling. 2.  Schedule a follow-up appointment at [facility] orth[facility].  Contact information is included in your discharge instructions.  To get improved relief of pain, you may need some injections in the wrist. 3.  Return if you see redness, you get fever, you see swelling of more of the hand or other concerning changes.

## 2023-10-15 NOTE — ED Provider Notes (Signed)
 [place] EMERGENCY DEPARTMENT AT [facility] Provider Note   CSN: [id number] Arrival date & time: 10/15/23  9165     Patient presents with: Wrist Pain   April Boone is a 38 y.o. female.   HPI Patient is approximately 3 months postpartum.  She started developing wrist pain at the base of the left first metacarpal about 6 weeks postpartum.  She traces out an area at the junction of the metacarpal and carpals across the radial side of the wrist.  She does not have midline pain.  Patient reports that it is so severe that she has to set her baby down sometimes and in certain positions she cannot maintain the position of her wrist.  She was recommended to take ibuprofen  and ice and follow-up with her PCP if symptoms were not improving.  She does not have any other complaints of the hand or elbow.  She had gestational diabetes but now reports that her blood sugars are controlled.    Prior to Admission medications   Medication Sig Start Date End Date Taking? Authorizing Provider  predniSONE  (DELTASONE ) 20 MG tablet 2 tabs po daily x 4 days 10/15/23  Yes Harshal Sirmon, Ludivina, MD  acetaminophen  (TYLENOL ) 500 MG tablet Take 2 tablets (1,000 mg total) by mouth every 6 (six) hours as needed for headache. 07/11/23   Leveque, Alyssa, MD  Ibuprofen  200 MG CAPS Take 2 capsules (400 mg total) by mouth 2 (two) times daily. 09/06/23   Vannie Reche RAMAN, NP  Prenatal Vit-Fe Fumarate-FA (PRENATAL VITAMINS) 28-0.8 MG TABS Take 1 tablet by mouth daily. Patient not taking: Reported on 08/17/2023 02/10/23   Constant, Peggy, MD    Allergies: Patient has no known allergies.    Review of Systems  Updated Vital Signs BP 130/86 (BP Location: Right Arm)   Pulse 88   Temp 98.3 F (36.8 C) (Oral)   Resp 18   SpO2 99%   Physical Exam Constitutional:      Comments: Alert nontoxic well in appearance.  HENT:     Mouth/Throat:     Pharynx: Oropharynx is clear.  Eyes:     Extraocular Movements:  Extraocular movements intact.  Pulmonary:     Effort: Pulmonary effort is normal.  Musculoskeletal:     Comments: Focal reproducible pain with extension and ulnar flexion of the wrist.  No significant pain over the carpal tunnel area.  Pain worse with flexion of the thumb and extension.  No redness or swelling.  Hand is warm and dry.  Cap refill less than 2 seconds.  Radial pulses 2+.  No pain to compression of the remainder of the forearm.     (all labs ordered are listed, but only abnormal results are displayed) Labs Reviewed - No data to display  EKG: None  Radiology: DG Wrist Complete Left Result Date: 10/15/2023 CLINICAL DATA:  Diffuse left wrist pain.  No known injury. EXAM: LEFT WRIST - COMPLETE 3+ VIEW COMPARISON:  None Available. FINDINGS: There is no evidence of fracture or dislocation. There is no evidence of arthropathy or other focal bone abnormality. Soft tissues are unremarkable. IMPRESSION: Negative. Electronically Signed   By: Camellia Candle M.D.   On: 10/15/2023 09:02     Procedures   Medications Ordered in the ED  acetaminophen  (TYLENOL ) tablet 1,000 mg (1,000 mg Oral Given 10/15/23 1003)  Medical Decision Making Amount and/or Complexity of Data Reviewed Radiology: ordered.   Patient presents as outlined with wrist pain ongoing now approximately a month and 1/2 to 2 months.  Physical exam findings are consistent with de Quervain tenosynovitis.  Plain film x-rays reviewed by radiology no acute findings.  Patient has been using ibuprofen  and icing without much relief.  At this time we will apply splinting and recommend continued icing and a short course of prednisone .  Patient is advised to follow-up with orthopedic specialist.  Contact information included for Ortho care with on-call physician Dr. Georgina.     Final diagnoses:  De Quervain's tenosynovitis, left    ED Discharge Orders          Ordered    predniSONE   (DELTASONE ) 20 MG tablet        10/15/23 9045               Armenta Canning, MD 10/15/23 1014

## 2023-10-15 NOTE — ED Triage Notes (Signed)
 C/o left wrist pain x weeks. States pain is intermittent and otc meds have not helped.
